# Patient Record
Sex: Male | Born: 1970 | Race: White | Hispanic: No | Smoking: Former smoker
Health system: Southern US, Community
[De-identification: ages and names within clinical notes are randomized; demographics above are authoritative.]

## PROBLEM LIST (undated history)

## (undated) DIAGNOSIS — J189 Pneumonia, unspecified organism: Secondary | ICD-10-CM

## (undated) DIAGNOSIS — I10 Essential (primary) hypertension: Secondary | ICD-10-CM

## (undated) DIAGNOSIS — F329 Major depressive disorder, single episode, unspecified: Secondary | ICD-10-CM

## (undated) DIAGNOSIS — F419 Anxiety disorder, unspecified: Secondary | ICD-10-CM

## (undated) DIAGNOSIS — K219 Gastro-esophageal reflux disease without esophagitis: Secondary | ICD-10-CM

## (undated) DIAGNOSIS — E669 Obesity, unspecified: Secondary | ICD-10-CM

## (undated) DIAGNOSIS — F32A Depression, unspecified: Secondary | ICD-10-CM

## (undated) DIAGNOSIS — E785 Hyperlipidemia, unspecified: Secondary | ICD-10-CM

## (undated) HISTORY — DX: Depression, unspecified: F32.A

## (undated) HISTORY — DX: Obesity, unspecified: E66.9

## (undated) HISTORY — DX: Anxiety disorder, unspecified: F41.9

## (undated) HISTORY — PX: NASAL SEPTUM SURGERY: SHX37

## (undated) HISTORY — PX: HAND SURGERY: SHX662

## (undated) HISTORY — DX: Major depressive disorder, single episode, unspecified: F32.9

## (undated) HISTORY — DX: Essential (primary) hypertension: I10

## (undated) HISTORY — PX: RHINOPLASTY: SUR1284

## (undated) HISTORY — DX: Hyperlipidemia, unspecified: E78.5

---

## 1996-06-10 HISTORY — PX: NASAL SEPTUM SURGERY: SHX37

## 2006-10-09 ENCOUNTER — Ambulatory Visit: Payer: Self-pay | Admitting: Internal Medicine

## 2006-10-15 ENCOUNTER — Ambulatory Visit: Payer: Self-pay | Admitting: Internal Medicine

## 2006-10-15 LAB — CONVERTED CEMR LAB
Basophils Absolute: 0 10*3/uL (ref 0.0–0.1)
Basophils Relative: 0.5 % (ref 0.0–1.0)
CO2: 32 meq/L (ref 19–32)
Cholesterol: 147 mg/dL (ref 0–200)
Creatinine, Ser: 1 mg/dL (ref 0.4–1.5)
HCT: 40.2 % (ref 39.0–52.0)
Hemoglobin: 14 g/dL (ref 13.0–17.0)
Hgb A1c MFr Bld: 5.1 % (ref 4.6–6.0)
LDL Cholesterol: 88 mg/dL (ref 0–99)
MCHC: 34.7 g/dL (ref 30.0–36.0)
Monocytes Absolute: 0.7 10*3/uL (ref 0.2–0.7)
Neutrophils Relative %: 55.1 % (ref 43.0–77.0)
Potassium: 4.2 meq/L (ref 3.5–5.1)
RDW: 12 % (ref 11.5–14.6)
Sodium: 140 meq/L (ref 135–145)
TSH: 2.73 microintl units/mL (ref 0.35–5.50)
Total Bilirubin: 0.9 mg/dL (ref 0.3–1.2)
Total Protein: 7 g/dL (ref 6.0–8.3)

## 2006-12-17 ENCOUNTER — Ambulatory Visit: Payer: Self-pay | Admitting: Internal Medicine

## 2007-04-16 DIAGNOSIS — J342 Deviated nasal septum: Secondary | ICD-10-CM | POA: Insufficient documentation

## 2007-04-16 DIAGNOSIS — E669 Obesity, unspecified: Secondary | ICD-10-CM | POA: Insufficient documentation

## 2007-04-16 DIAGNOSIS — E785 Hyperlipidemia, unspecified: Secondary | ICD-10-CM | POA: Insufficient documentation

## 2007-04-16 DIAGNOSIS — K219 Gastro-esophageal reflux disease without esophagitis: Secondary | ICD-10-CM | POA: Insufficient documentation

## 2007-04-16 DIAGNOSIS — I10 Essential (primary) hypertension: Secondary | ICD-10-CM | POA: Insufficient documentation

## 2007-05-26 ENCOUNTER — Ambulatory Visit: Payer: Self-pay | Admitting: Internal Medicine

## 2007-05-26 DIAGNOSIS — F411 Generalized anxiety disorder: Secondary | ICD-10-CM | POA: Insufficient documentation

## 2007-06-08 ENCOUNTER — Ambulatory Visit: Payer: Self-pay | Admitting: Internal Medicine

## 2007-06-08 LAB — CONVERTED CEMR LAB
ALT: 29 units/L (ref 0–53)
Alkaline Phosphatase: 69 units/L (ref 39–117)
BUN: 11 mg/dL (ref 6–23)
CO2: 29 meq/L (ref 19–32)
Calcium: 9.4 mg/dL (ref 8.4–10.5)
Creatinine, Ser: 1 mg/dL (ref 0.4–1.5)
Total Bilirubin: 0.9 mg/dL (ref 0.3–1.2)
Total Protein: 7.3 g/dL (ref 6.0–8.3)

## 2007-06-09 ENCOUNTER — Encounter: Payer: Self-pay | Admitting: Internal Medicine

## 2007-08-22 ENCOUNTER — Encounter: Payer: Self-pay | Admitting: Internal Medicine

## 2007-11-24 ENCOUNTER — Ambulatory Visit: Payer: Self-pay | Admitting: Internal Medicine

## 2007-11-27 ENCOUNTER — Telehealth: Payer: Self-pay | Admitting: Internal Medicine

## 2007-12-10 ENCOUNTER — Ambulatory Visit: Payer: Self-pay | Admitting: Internal Medicine

## 2007-12-10 LAB — CONVERTED CEMR LAB
ALT: 24 units/L (ref 0–53)
AST: 21 units/L (ref 0–37)
Albumin: 4 g/dL (ref 3.5–5.2)
Alkaline Phosphatase: 58 units/L (ref 39–117)
BUN: 14 mg/dL (ref 6–23)
Bilirubin, Direct: 0.1 mg/dL (ref 0.0–0.3)
CO2: 30 meq/L (ref 19–32)
CRP, High Sensitivity: 1 (ref 0.00–5.00)
Calcium: 9.1 mg/dL (ref 8.4–10.5)
Chloride: 105 meq/L (ref 96–112)
Cholesterol: 147 mg/dL (ref 0–200)
Creatinine, Ser: 0.9 mg/dL (ref 0.4–1.5)
GFR calc Af Amer: 123 mL/min
GFR calc non Af Amer: 102 mL/min
Glucose, Bld: 103 mg/dL — ABNORMAL HIGH (ref 70–99)
HDL: 35 mg/dL — ABNORMAL LOW (ref >39.0)
Hgb A1c MFr Bld: 5.5 % (ref 4.6–6.0)
LDL Cholesterol: 97 mg/dL (ref 0–99)
Potassium: 4 meq/L (ref 3.5–5.1)
Sodium: 140 meq/L (ref 135–145)
Total Bilirubin: 0.9 mg/dL (ref 0.3–1.2)
Total CHOL/HDL Ratio: 4.2
Total Protein: 6.7 g/dL (ref 6.0–8.3)
Triglycerides: 75 mg/dL (ref 0–149)
VLDL: 15 mg/dL (ref 0–40)

## 2007-12-13 ENCOUNTER — Encounter: Payer: Self-pay | Admitting: Internal Medicine

## 2008-01-15 ENCOUNTER — Ambulatory Visit: Payer: Self-pay | Admitting: Internal Medicine

## 2008-02-16 ENCOUNTER — Ambulatory Visit (HOSPITAL_COMMUNITY): Admission: RE | Admit: 2008-02-16 | Discharge: 2008-02-16 | Payer: Self-pay | Admitting: Emergency Medicine

## 2008-09-12 ENCOUNTER — Ambulatory Visit: Payer: Self-pay | Admitting: Internal Medicine

## 2009-03-31 ENCOUNTER — Ambulatory Visit: Payer: Self-pay | Admitting: Internal Medicine

## 2009-04-03 ENCOUNTER — Telehealth: Payer: Self-pay | Admitting: Internal Medicine

## 2009-04-04 ENCOUNTER — Ambulatory Visit: Payer: Self-pay | Admitting: Internal Medicine

## 2009-04-04 LAB — CONVERTED CEMR LAB
ALT: 27 units/L (ref 0–53)
AST: 22 units/L (ref 0–37)
Alkaline Phosphatase: 70 units/L (ref 39–117)
BUN: 17 mg/dL (ref 6–23)
Bilirubin, Direct: 0 mg/dL (ref 0.0–0.3)
Chloride: 104 meq/L (ref 96–112)
HDL: 39 mg/dL — ABNORMAL LOW (ref >39.00)
LDL Cholesterol: 107 mg/dL — ABNORMAL HIGH (ref 0–99)
Potassium: 4.5 meq/L (ref 3.5–5.1)
Sodium: 140 meq/L (ref 135–145)
Total Bilirubin: 0.8 mg/dL (ref 0.3–1.2)
Total CHOL/HDL Ratio: 4
VLDL: 17.8 mg/dL (ref 0.0–40.0)

## 2009-04-05 ENCOUNTER — Telehealth: Payer: Self-pay | Admitting: Internal Medicine

## 2009-04-05 ENCOUNTER — Encounter: Payer: Self-pay | Admitting: Internal Medicine

## 2009-07-03 ENCOUNTER — Telehealth: Payer: Self-pay | Admitting: Internal Medicine

## 2009-12-12 ENCOUNTER — Telehealth: Payer: Self-pay | Admitting: Internal Medicine

## 2009-12-21 ENCOUNTER — Encounter: Payer: Self-pay | Admitting: Internal Medicine

## 2010-03-19 ENCOUNTER — Ambulatory Visit: Payer: Self-pay | Admitting: Internal Medicine

## 2010-03-30 ENCOUNTER — Ambulatory Visit: Payer: Self-pay | Admitting: Internal Medicine

## 2010-03-30 DIAGNOSIS — J309 Allergic rhinitis, unspecified: Secondary | ICD-10-CM | POA: Insufficient documentation

## 2010-04-10 ENCOUNTER — Telehealth: Payer: Self-pay | Admitting: Internal Medicine

## 2010-04-12 ENCOUNTER — Encounter: Payer: Self-pay | Admitting: Internal Medicine

## 2010-04-12 LAB — CONVERTED CEMR LAB
AST: 21 units/L (ref 0–37)
Alkaline Phosphatase: 74 units/L (ref 39–117)
Calcium: 9.2 mg/dL (ref 8.4–10.5)
GFR calc non Af Amer: 94.56 mL/min (ref >60)
HDL: 35.9 mg/dL — ABNORMAL LOW (ref >39.00)
IgE (Immunoglobulin E), Serum: 60.2 intl units/mL (ref 0.0–180.0)
LDL Cholesterol: 88 mg/dL (ref 0–99)
Potassium: 4.3 meq/L (ref 3.5–5.1)
Sodium: 140 meq/L (ref 135–145)
Total Bilirubin: 0.7 mg/dL (ref 0.3–1.2)
VLDL: 21.2 mg/dL (ref 0.0–40.0)

## 2010-04-13 ENCOUNTER — Encounter: Payer: Self-pay | Admitting: Internal Medicine

## 2010-06-19 ENCOUNTER — Telehealth: Payer: Self-pay | Admitting: Internal Medicine

## 2010-06-19 ENCOUNTER — Encounter: Payer: Self-pay | Admitting: Internal Medicine

## 2010-06-26 ENCOUNTER — Telehealth: Payer: Self-pay | Admitting: Internal Medicine

## 2010-06-27 ENCOUNTER — Encounter: Payer: Self-pay | Admitting: Internal Medicine

## 2010-07-10 NOTE — Assessment & Plan Note (Signed)
Summary: cpx/mhf--Rm    Vital Signs:  Patient profile:   40 year old male Height:      73 inches Weight:      254 pounds BMI:     33.63 O2 Sat:      97 % on Room air Temp:     97.6 degrees F oral Pulse rate:   67 / minute Pulse rhythm:   regular Resp:     16 per minute BP sitting:   130 / 70  (left arm) Cuff size:   large  Vitals Entered By: Mervin Kung CMA Duncan Dull) (March 30, 2010 2:32 PM)  O2 Flow:  Room air CC: Pt here for physical, not fasting. Has not had labs checked. Is Patient Diabetic? No Pain Assessment Patient in pain? no        Primary Care Abelino Tippin:  Dondra Spry DO  CC:  Pt here for physical and not fasting. Has not had labs checked.[name]  History of Present Illness:   40 y/o white male with hx of htn and hyperlipidemia for f/u no sign interval med hx tolerating meds  Pt states both shoulders are sore since tennis tournament last week.   slight wt loss since prev visit  anxiety / depression - stable  Preventive Screening-Counseling & Management  Alcohol-Tobacco     Alcohol drinks/day: 10 per week     Alcohol type: all     Alcohol Counseling: to decrease amount and/or frequency of alcohol intake     Smoking Status: current     Packs/Day: 0.25     Tobacco Counseling: not to resume use of tobacco products  Allergies (verified): No Known Drug Allergies  Past History:  Past Medical History: Obesity  Hypertension Hyperlipidemia  Anxiety/depression     Family History: FAMILY HISTORY:  Mother is alive, age 41, healthy. Father is age 38 and has hypertension and high cholesterol, also had a history of thyroid cancer.  Grandparents noted to have coronary disease in their 30s, and one of his grandparents is diabetic.         Social History: The patient is happily married for the last 13 years.  He works as a Economist  -  one pack per week.  Patient mainly smokes with social drinking. Alcohol use-yes (10 to 14 drinks  per week)       Review of Systems  The patient denies weight gain, chest pain, syncope, dyspnea on exertion, prolonged cough, abdominal pain, melena, hematochezia, severe indigestion/heartburn, and depression.    Physical Exam  General:  alert, well-developed, and well-nourished.   Head:  normocephalic and atraumatic.   Eyes:  pupils equal, pupils round, and pupils reactive to light.   Ears:  R ear normal and L ear normal.   Mouth:  pharynx pink and moist.   Neck:  No deformities, masses, or tenderness noted. Lungs:  normal respiratory effort and normal breath sounds.   Heart:  normal rate, regular rhythm, no murmur, and no gallop.   Abdomen:  soft, non-tender, normal bowel sounds, no masses, no hepatomegaly, and no splenomegaly.   Extremities:  No lower extremity edema  Neurologic:  cranial nerves II-XII intact and gait normal.   Psych:  normally interactive, good eye contact, not anxious appearing, and not depressed appearing.     Impression & Recommendations:  Problem # 1:  PHYSICAL EXAMINATION (ICD-V70.0) Reviewed adult health maintenance protocols.  Td Booster: Historical (07/10/2001)   Flu Vax: Historical (03/02/2010)   Chol: 164 (  04/04/2009)   HDL: 39.00 (04/04/2009)   LDL: 107 (04/04/2009)   TG: 89.0 (04/04/2009) TSH: 1.80 (04/04/2009)   HgbA1C: 5.5 (12/10/2007)     Problem # 2:  HYPERLIPIDEMIA (ICD-272.4) Assessment: Unchanged  His updated medication list for this problem includes:    Simvastatin 40 Mg Tabs (Simvastatin) ..... One by mouth qhs  Orders: T-Hepatic Function [id number]) T-Lipid Profile [phone])  Problem # 3:  ANXIETY (ICD-300.00) Assessment: Unchanged  His updated medication list for this problem includes:    Pristiq 50 Mg Tb24 (Desvenlafaxine succinate) .[name]... Take 1 tablet by mouth once a day    Clonazepam 0.5 Mg Tabs (Clonazepam) .[name]... 1/2 to one tap by mouth once daily prn  Discussed medication use and relaxation techniques.   Problem  # 4:  HYPERTENSION (ICD-401.9) Assessment: Unchanged  His updated medication list for this problem includes:    Amlodipine Besylate 5 Mg Tabs (Amlodipine besylate) ..... One by mouth once daily    Losartan Potassium 100 Mg Tabs (Losartan potassium) ..... One by mouth once daily  Orders: T-Basic Metabolic Panel [id number])  BP today: 130/70 Prior BP: 110/80 (03/31/2009)  Labs Reviewed: K+: 4.5 (04/04/2009) Creat: : 0.9 (04/04/2009)   Chol: 164 (04/04/2009)   HDL: 39.00 (04/04/2009)   LDL: 107 (04/04/2009)   TG: 89.0 (04/04/2009)  Complete Medication List: 1)  Amlodipine Besylate 5 Mg Tabs (Amlodipine besylate) .... One by mouth once daily 2)  Losartan Potassium 100 Mg Tabs (Losartan potassium) .... One by mouth once daily 3)  Simvastatin 40 Mg Tabs (Simvastatin) .... One by mouth qhs 4)  Pristiq 50 Mg Tb24 (Desvenlafaxine succinate) .... Take 1 tablet by mouth once a day 5)  Clonazepam 0.5 Mg Tabs (Clonazepam) .... 1/2 to one tap by mouth once daily prn  Other Orders: T- * Misc. Laboratory test [id number])  Patient Instructions: 1)  Please schedule a follow-up appointment in 6 months. Prescriptions: CLONAZEPAM 0.5 MG  TABS (CLONAZEPAM) 1/2 to one tap by mouth once daily prn  #30 x 2   Entered and Authorized by:   D. Thomos Lemons DO   Signed by:   D. Thomos Lemons DO on 03/30/2010   Method used:   Print then Give to Patient   RxID:   [id number] PRISTIQ 50 MG  TB24 (DESVENLAFAXINE SUCCINATE) Take 1 tablet by mouth once a day  #90 x 1   Entered and Authorized by:   D. Thomos Lemons DO   Signed by:   D. Thomos Lemons DO on 03/30/2010   Method used:   Electronically to        [facility] ([facility]ail)       [address]       [place], [place]  [address]       Ph: [phone]       Fax: [phone]   RxID:   [id number] SIMVASTATIN 40 MG  TABS (SIMVASTATIN) one by mouth qhs  #90 x 1   Entered and Authorized by:   D. Thomos Lemons DO   Signed by:    D. Thomos Lemons DO on 03/30/2010   Method used:   Electronically to        [facility]       [address]       [place], [place]  [address]       Ph: [phone]       Fax: [phone]  RxID:   [id number] LOSARTAN POTASSIUM 100 MG TABS (LOSARTAN POTASSIUM) one by mouth once daily  #90 x 1   Entered and Authorized by:   D. Thomos Lemons DO   Signed by:   D. Thomos Lemons DO on 03/30/2010   Method used:   Electronically to        [facility])       [address]       [place], [place]  [address]       Ph: [phone]       Fax: [phone]   RxID:   [id number] AMLODIPINE BESYLATE 5 MG  TABS (AMLODIPINE BESYLATE) one by mouth once daily  #90 x 1   Entered and Authorized by:   D. Thomos Lemons DO   Signed by:   D. Thomos Lemons DO on 03/30/2010   Method used:   Electronically to        [facility]       [address]       [place], [place]  [address]       Ph: [phone]       Fax: [phone]   RxID:   [id number]    Orders Added: 1)  T-Basic Metabolic Panel [phone] 2)  T-Hepatic Function [[id number]] 3)  T-Lipid Profile [[id number]] 4)  T- * Misc. Laboratory test [phone] 5)  Est. Patient age 65-39 [[id number]]   Immunization History:  Influenza Immunization History:    Influenza:  historical (03/02/2010)   Immunization History:  Influenza Immunization History:    Influenza:  Historical (03/02/2010)   Current Allergies (reviewed today): No known allergies  Appended Document: Orders Update    Clinical Lists Changes  Orders: Added new Test order of TLB-BMP (Basic Metabolic Panel-BMET) (80048-[id number]) - Signed Added new Test order of TLB-Hepatic/Liver Function Pnl (80076-HEPATIC) - Signed Added new Test order of TLB-Lipid Panel (80061-LIPID) - Signed

## 2010-07-10 NOTE — Consult Note (Signed)
Summary: [facility]  [facility]   Imported By: Lanelle Bal 01/02/2010 10:45:10  _____________________________________________________________________  External Attachment:    Type:   Image     Comment:   External Document

## 2010-07-10 NOTE — Progress Notes (Signed)
Summary: Medication Refill  Phone Note Call from Patient   Caller: Patient Call For: Henry Houston  Summary of Call: patient is scheduled to see Dr Artist Pais on 1021 for his cpx.  He needs refill ons his meds till them  Initial call taken by: Roselle Locus,  December 12, 2009 9:00 AM  Follow-up for Phone Call        call returned to patient at [phone], no answer, voice message left for patient to return the call regarding the medication refill that is needed Follow-up by: Glendell Docker CMA,  December 12, 2009 12:22 PM  Additional Follow-up for Phone Call Additional follow up Details #1::        Call reurned to patient regarding medication request, he states that it was taken care of and his wife found out that he had refills Additional Follow-up by: Glendell Docker CMA,  December 14, 2009 8:35 AM

## 2010-07-10 NOTE — Letter (Signed)
    at [facility] [address] [place], [place]  [address]  [place] Phone: [phone]      April 13, 2010   Henry Houston [address] [place], [place] [address]  RE:  LAB RESULTS  Dear  Henry Houston,  The following is an interpretation of your most recent lab tests.  Please take note of any instructions provided or changes to medications that have resulted from your lab work.     Your allergy panel was positive for multiple items.  See attached report.   Please see handout on allergen avoidance.  I suggest you use allergra 180 mg OTC regularly.       Sincerely Yours,    Dr. Thomos Lemons  Appended Document:  mailed

## 2010-07-10 NOTE — Letter (Signed)
   [name] at [facility] [address] [name], [place]  [address]  [place] Phone: [phone]      April 12, 2010   Henry Houston [address] [place], [place] [address]  RE:  LAB RESULTS  Dear  [name],  The following is an interpretation of your most recent lab tests.  Please take note of any instructions provided or changes to medications that have resulted from your lab work.  ELECTROLYTES:  Good - no changes needed  KIDNEY FUNCTION TESTS:  Good - no changes needed  LIVER FUNCTION TESTS:  Good - no changes needed  LIPID PANEL:  Good - no changes needed Triglyceride: 106.0   Cholesterol: 145   LDL: 88   HDL: 35.90   Chol/HDL%:  4         Sincerely Yours,    Dr. Thomos Lemons  Appended Document:  mailed

## 2010-07-10 NOTE — Progress Notes (Signed)
Summary: Medication Change  Phone Note Outgoing Call   Summary of Call: call pt - please make sure pt comes in for labs. also, I suggest pt take lower dose of simvastatin.  see rx.  pt should take 1/2 of current dose of 40 mg Initial call taken by: D. Thomos Lemons DO,  April 10, 2010 5:28 PM  Follow-up for Phone Call        call placed to patient at [phone]. Patient has been advised per Dr Artist Pais instructions. He states that he will have his blood work done tomorrow Follow-up by: Glendell Docker CMA,  April 11, 2010 8:58 AM    New/Updated Medications: SIMVASTATIN 20 MG TABS (SIMVASTATIN) one by mouth q pm Prescriptions: SIMVASTATIN 20 MG TABS (SIMVASTATIN) one by mouth q pm  #90 x 1   Entered and Authorized by:   D. Thomos Lemons DO   Signed by:   D. Thomos Lemons DO on 04/10/2010   Method used:   Electronically to        [facility]       [address]       [place], [place]  [address]       Ph: [phone]       Fax: [phone]   RxID:   [id number]

## 2010-07-10 NOTE — Progress Notes (Signed)
Summary: Clonazepam Refill  Phone Note Refill Request Message from:  Fax from Pharmacy on July 03, 2009 10:09 AM  Refills Requested: Medication #1:  CLONAZEPAM 0.5 MG  TABS 1/2 to one tap by mouth once daily prn. Next Appointment Scheduled: 03/30/10 Initial call taken by: Michaelle Copas,  July 03, 2009 10:09 AM  Follow-up for Phone Call        ok to refill x5 Follow-up by: D. Thomos Lemons DO,  July 03, 2009 6:01 PM  Additional Follow-up for Phone Call Additional follow up Details #1::        Rx called to pharmacy Additional Follow-up by: Glendell Docker CMA,  July 04, 2009 9:18 AM    Prescriptions: CLONAZEPAM 0.5 MG  TABS (CLONAZEPAM) 1/2 to one tap by mouth once daily prn  #30 x 5   Entered by:   Glendell Docker CMA   Authorized by:   D. Thomos Lemons DO   Signed by:   Glendell Docker CMA on 07/04/2009   Method used:   Telephoned to ...       [facility]       [address]       [place], [place]  [address]       Ph: [phone]       Fax: [phone]   RxID:   [id number]

## 2010-07-12 NOTE — Consult Note (Signed)
Summary: [facility]  [facility]   Imported By: Lanelle Bal 07/03/2010 13:52:57  _____________________________________________________________________  External Attachment:    Type:   Image     Comment:   External Document

## 2010-07-12 NOTE — Progress Notes (Signed)
Summary: Mail Order Refills  Phone Note Refill Request Message from:  Fax from Pharmacy on June 19, 2010 11:57 AM  Refills Requested: Medication #1:  PRISTIQ 50 MG  TB24 Take 1 tablet by mouth once a day   Dosage confirmed as above?Dosage Confirmed   Supply Requested: 3 months  Medication #2:  AMLODIPINE BESYLATE 5 MG  TABS one by mouth once daily   Dosage confirmed as above?Dosage Confirmed   Supply Requested: 3 months [facility]   Method Requested: Electronic Next Appointment Scheduled: No Future Appointments on file Initial call taken by: Glendell Docker CMA,  June 19, 2010 11:58 AM  Follow-up for Phone Call        ok for refill Follow-up by: D. Thomos Lemons DO,  June 19, 2010 1:08 PM  Additional Follow-up for Phone Call Additional follow up Details #1::        Rx faxed to pharmacy Additional Follow-up by: Glendell Docker CMA,  June 19, 2010 2:54 PM    Prescriptions: PRISTIQ 50 MG  TB24 (DESVENLAFAXINE SUCCINATE) Take 1 tablet by mouth once a day  #90 x 2   Entered by:   Glendell Docker CMA   Authorized by:   D. Thomos Lemons DO   Signed by:   Glendell Docker CMA on 06/19/2010   Method used:   Faxed to ...       [facility] (mail-order)             , [place]         Ph: [phone]       Fax: [phone]   RxID:   [id number] AMLODIPINE BESYLATE 5 MG  TABS (AMLODIPINE BESYLATE) one by mouth once daily  #90 x 2   Entered by:   Glendell Docker CMA   Authorized by:   D. Thomos Lemons DO   Signed by:   Glendell Docker CMA on 06/19/2010   Method used:   Faxed to ...       [facility] (mail-order)             , [place]         Ph: [phone]       Fax: [phone]   RxID:   [id number]

## 2010-07-12 NOTE — Progress Notes (Signed)
Summary: Drug Interaction  Phone Note Other Incoming   Caller: [facility]- [phone] Summary of Call: [facility] pharmacy called stating medicatiion .Amlodipine and   Simvastatin are contradindicated for -myopathy. The pharmacist would like to know if Dr Artist Pais wanted to decrease Simvastatin ,not to exceed 20mg   or change to a different. If no changes, they need to be made aware.  Reference# [id number]   Initial call taken by: Glendell Docker CMA,  June 26, 2010 2:47 PM  Follow-up for Phone Call        simvastatin already decreased to 20 mg  Follow-up by: D. Thomos Lemons DO,  June 26, 2010 3:23 PM  Additional Follow-up for Phone Call Additional follow up Details #1::        spoke with pharmacist Gus at [facility] , he has been advised of medication change. Additional Follow-up by: Glendell Docker CMA,  June 27, 2010 9:47 AM

## 2010-07-18 NOTE — Medication Information (Signed)
Summary: Simvastatin/[facility]  Simvastatin/[facility]   Imported By: Sherian Rein 07/10/2010 11:50:01  _____________________________________________________________________  External Attachment:    Type:   Image     Comment:   External Document

## 2010-10-26 NOTE — Assessment & Plan Note (Signed)
[facility]                           PRIMARY CARE OFFICE NOTE   Henry Houston, Henry Houston                       MRN:          [id number]  DATE:10/09/2006                            DOB:          1971/05/22    CHIEF COMPLAINT:  New patient to practice.   HISTORY OF PRESENT ILLNESS:  The patient is a 40 year old white male,  here to establish primary care.  He has moved to [place] from  [place], [place], approximately 4 months ago.   PAST MEDICAL HISTORY:  Significant for hypertension and high  cholesterol.  He has struggled with obesity.  He was started on blood  pressure medicines in his early 60s.  The patient states that his father  has had similar issues with hypertension at an early age.  He is not  sure if a workup for secondary hypertension has been performed.  He  reports having  negative treadmill stress test.  He is well controlled  on his current regimen of amlodipine 5 mg, Cozaar 100 mg. He denies any  personal history of heart disease, stroke or peripheral vascular  disease.   He has been on Lipitor for high cholesterol, though he is currently on  simvastatin due to cost reasons.  His last lipid panel showed that his  LDL is approximately 110, triglycerides were normal at 98, HDL was 48.   Other issues include generalized anxiety disorder.  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  His previous physician tried  several SSRIs and paroxetine generic 20 mg b.i.d. seemed to work the  best.  He denies any sexual dysfunction or other side effects.   PAST MEDICAL HISTORY:  1. Hypertension, controlled.  2. Hyperlipidemia, primary prevention.  3. History of torn ligaments in his hand, 1990s.  4. Deviated septum repair in 1998.  5. Obesity.  6. Gastroesophageal reflux disease.   CURRENT MEDICATIONS:  1. Amlodipine 5 mg once daily.  2. Cozaar 100 mg once daily.  3. Simvastatin 20 mg at bedtime.  4. Paroxetine 20 mg b.i.d.  5.  Clonazepam 0.5 mg p.r.n.   ALLERGIES TO MEDICATIONS:  None known.   SOCIAL HISTORY:  The patient is happily married for the last 9 years. He  works as a Research scientist (medical), does not have any children.   HABITS:  Occasional alcohol, averages 7 drinks per week.  He does have a  history of social smoking. He does not smoke on a regular basis.   FAMILY HISTORY:  Mother is alive, age 84, healthy. Father is age 38 and  has hypertension and high cholesterol, also had a history of thyroid  cancer.  Grandparents noted to have coronary disease in their 86s, and  one of his grandparents is diabetic.   REVIEW OF SYSTEMS:  No HEENT symptoms.  Denies any chest pain or  shortness of breath.  He plays tennis on a regular basis. No GI  symptoms.  All other systems negative.   PHYSICAL EXAMINATION:  VITAL SIGNS:  Weight is 252 pounds.  Temperature  is 97.7, pulse is 68, BP is 117/76  in the left arm in a seated position.  GENERAL:  The patient is a pleasant, somewhat overweight/obese 35-year-  old white male in no apparent distress.  HEENT:  Normocephalic, atraumatic. Pupils were equal and reactive to  light bilaterally.  Extraocular mobility was intact.  Patient was  anicteric.  Patient had mild injected conjunctivae bilaterally.  External auditory canals and tympanic membranes were clear bilaterally.  Hearing was grossly normal.  NECK:  Supple. No adenopathy, carotid bruit or thyromegaly.  CHEST:  Normal respiratory effort.  Chest was clear to auscultation  bilaterally and no rhonchi, rales or wheezing.  CARDIOVASCULAR:  Regular rate and rhythm. No significant murmurs, rubs  or gallops appreciated.  ABDOMEN:  Protuberant but nontender.  Positive bowel sounds.  No  organomegaly.  MUSCULOSKELETAL EXAM:  No clubbing, cyanosis or edema.  SKIN:  Warm and dry.  NEUROLOGIC:  Cranial nerves II-XII grossly intact.  It was nonfocal.   LABORATORY DATA:  This was from July 2007.  Comprehensive metabolic  profile  notable for serum glucose slightly elevated at 102, BUN was 12,  serum creatinine was 0.9, potassium was 4.5. His liver enzymes are  unremarkable.  Total cholesterol 176, triglycerides 90, HDL 48, LDL 110.   IMPRESSION:  1. Hypertension, controlled.  2. Hyperlipidemia/primary prevention.  3. Generalized anxiety disorder.  4. Obesity with abnormal glucose.  5. Health maintenance.   RECOMMENDATIONS:  The patient is to continue his current regimen.  We  discussed the importance of risk factor management.  He is to maintain  his cholesterol and antihypertensives.  We discussed general weight loss  measures and made some dietary suggestions for weight loss. We  established a goal of 10% of his body weight within the next 6 months'  time.   I  would like to review his records to see if workup for secondary  hypertension had been performed.  If it has not, we will order a renal  artery Doppler and check serum free metanephrine.   Followup time is in 2 or 3 months.  He was asked to have repeat follow  up labs in 1 week.     Barbette Hair. Artist Pais, DO  Electronically Signed    RDY/MedQ  DD: 10/09/2006  DT: 10/09/2006  Job #: [id number]

## 2010-11-09 ENCOUNTER — Telehealth: Payer: Self-pay | Admitting: Internal Medicine

## 2010-11-09 MED ORDER — CLONAZEPAM 0.5 MG PO TABS: ORAL_TABLET | ORAL | Status: DC

## 2010-11-09 NOTE — Telephone Encounter (Signed)
Refill-klonopin 0.5mg  tab gene. Take half to one tablet by mouth daily as needed. Qty 30. Last fill 12.12.11

## 2010-11-09 NOTE — Telephone Encounter (Signed)
Rx refill phone to [place] at [facility] on [address]

## 2010-11-09 NOTE — Telephone Encounter (Signed)
OK to send rx for #30 tabs with zero refills

## 2010-12-04 ENCOUNTER — Other Ambulatory Visit: Payer: Self-pay | Admitting: Internal Medicine

## 2010-12-04 NOTE — Telephone Encounter (Signed)
Rx refill sent to pharmacy.Patient is due for office visit 

## 2010-12-07 ENCOUNTER — Encounter: Payer: Self-pay | Admitting: Internal Medicine

## 2011-01-01 ENCOUNTER — Telehealth: Payer: Self-pay | Admitting: Internal Medicine

## 2011-01-01 NOTE — Telephone Encounter (Signed)
Rx refill denied. Patient is past due for office visit

## 2011-01-01 NOTE — Telephone Encounter (Signed)
Refill-losartan pot 100mg  tab nort. Take one tablet by mouth one time daily. Qty 30. Last fill 6.26.12

## 2011-01-08 ENCOUNTER — Telehealth: Payer: Self-pay | Admitting: Internal Medicine

## 2011-01-08 MED ORDER — LOSARTAN POTASSIUM 100 MG PO TABS: 100.0000 mg | ORAL_TABLET | Freq: Every day | ORAL | Status: DC

## 2011-01-08 NOTE — Telephone Encounter (Signed)
Patients wife robin called in stating that patient took the last losartan this morning. I told robin that he needed to be seen before more refills are givin. She did state that patient has upcoming appt on 01-16-11. She wanted to know if we can refill one time to last him until his appt. [facility] [address].

## 2011-01-08 NOTE — Telephone Encounter (Signed)
Rx refill sent to pharmacy. Patient is scheduled for follow up 01/16/2011

## 2011-01-16 ENCOUNTER — Ambulatory Visit: Payer: Self-pay | Admitting: Internal Medicine

## 2011-01-16 ENCOUNTER — Encounter: Payer: Self-pay | Admitting: Internal Medicine

## 2011-01-16 ENCOUNTER — Ambulatory Visit (INDEPENDENT_AMBULATORY_CARE_PROVIDER_SITE_OTHER): Payer: BC Managed Care – PPO | Admitting: Internal Medicine

## 2011-01-16 VITALS — BP 124/74 | HR 65 | Temp 97.9°F | Resp 20 | Wt 256.0 lb

## 2011-01-16 DIAGNOSIS — E669 Obesity, unspecified: Secondary | ICD-10-CM

## 2011-01-16 DIAGNOSIS — D229 Melanocytic nevi, unspecified: Secondary | ICD-10-CM

## 2011-01-16 DIAGNOSIS — I10 Essential (primary) hypertension: Secondary | ICD-10-CM

## 2011-01-16 DIAGNOSIS — D239 Other benign neoplasm of skin, unspecified: Secondary | ICD-10-CM

## 2011-01-16 DIAGNOSIS — E785 Hyperlipidemia, unspecified: Secondary | ICD-10-CM

## 2011-01-16 MED ORDER — AMLODIPINE BESYLATE 5 MG PO TABS: 5.0000 mg | ORAL_TABLET | Freq: Every day | ORAL | Status: DC

## 2011-01-16 MED ORDER — SIMVASTATIN 20 MG PO TABS: 20.0000 mg | ORAL_TABLET | Freq: Every evening | ORAL | Status: DC

## 2011-01-16 MED ORDER — LOSARTAN POTASSIUM 100 MG PO TABS: 100.0000 mg | ORAL_TABLET | Freq: Every day | ORAL | Status: DC

## 2011-01-16 NOTE — Patient Instructions (Signed)
Please schedule lipid/lft 272.4 and chem7 v58.69 prior to next visit 

## 2011-01-17 NOTE — Assessment & Plan Note (Signed)
Obtain fasting lipid profile and liver function tests. Recommend dietary modification, exercise and weight loss

## 2011-01-17 NOTE — Progress Notes (Signed)
  Subjective:    Patient ID: Yafet Cline, male    DOB: 07-25-1970, 40 y.o.   MRN: [id number]  HPI Patient presents to clinic for evaluation of tolerate statin therapy without myalgias or abnormal LFTs. Followed by psychiatry for history of anxiety which appears to be under good control. Notes atypical mole on right hand with a variable pigmented. No other complaints.  Review PMH, medications and allergies  Review of Systems see history of present illness     Objective:   Physical Exam  Physical Exam  Vitals reviewed. Constitutional:  appears well-developed and well-nourished. No distress.  HENT:  Head: Normocephalic and atraumatic.  Right Ear: Tympanic membrane, external ear and ear canal normal.  Left Ear: Tympanic membrane, external ear and ear canal normal.  Nose: Nose normal.  Mouth/Throat: Oropharynx is clear and moist. No oropharyngeal exudate.  Eyes: Conjunctivae and EOM are normal. Pupils are equal, round, and reactive to light. Right eye exhibits no discharge. Left eye exhibits no discharge. No scleral icterus.  Neck: Neck supple. No thyromegaly present.  Cardiovascular: Normal rate, regular rhythm and normal heart sounds.  Exam reveals no gallop and no friction rub.   No murmur heard. Pulmonary/Chest: Effort normal and breath sounds normal. No respiratory distress.  has no wheezes.  has no rales.  Lymphadenopathy:   no cervical adenopathy.  Neurological:  is alert.  Skin: Skin is warm and dry.  not diaphoretic. Between right second and third finger small mole with irregular pigmentation Psychiatric: normal mood and affect.     Assessment & Plan:

## 2011-01-17 NOTE — Assessment & Plan Note (Signed)
Stable. Continue current regimen. monitor blood pressure as an outpatient and followup in clinic as scheduled

## 2011-01-17 NOTE — Assessment & Plan Note (Signed)
Recommend beginning regular exercise program

## 2011-02-21 ENCOUNTER — Telehealth: Payer: Self-pay | Admitting: Internal Medicine

## 2011-02-21 MED ORDER — SIMVASTATIN 20 MG PO TABS: 20.0000 mg | ORAL_TABLET | Freq: Every evening | ORAL | Status: DC

## 2011-02-21 MED ORDER — LOSARTAN POTASSIUM 100 MG PO TABS: 100.0000 mg | ORAL_TABLET | Freq: Every day | ORAL | Status: DC

## 2011-02-21 NOTE — Telephone Encounter (Signed)
New rx  simvastatin 20mg  tab. Qty 90.  Losartan 100mg  tabs. Qty 90

## 2011-02-21 NOTE — Telephone Encounter (Signed)
Rx refills sent to pharmacy. 

## 2011-03-15 ENCOUNTER — Other Ambulatory Visit: Payer: Self-pay | Admitting: Internal Medicine

## 2011-03-15 NOTE — Telephone Encounter (Signed)
Rx refill sent to [facility]. 

## 2011-07-08 ENCOUNTER — Other Ambulatory Visit: Payer: BC Managed Care – PPO

## 2011-07-15 ENCOUNTER — Ambulatory Visit: Payer: BC Managed Care – PPO | Admitting: Internal Medicine

## 2011-07-26 ENCOUNTER — Ambulatory Visit (INDEPENDENT_AMBULATORY_CARE_PROVIDER_SITE_OTHER): Payer: 59 | Admitting: Internal Medicine

## 2011-07-26 ENCOUNTER — Encounter: Payer: Self-pay | Admitting: Internal Medicine

## 2011-07-26 DIAGNOSIS — I10 Essential (primary) hypertension: Secondary | ICD-10-CM

## 2011-07-26 DIAGNOSIS — M545 Low back pain, unspecified: Secondary | ICD-10-CM

## 2011-07-26 MED ORDER — AMLODIPINE BESYLATE 5 MG PO TABS: 5.0000 mg | ORAL_TABLET | Freq: Every day | ORAL | Status: DC

## 2011-07-26 MED ORDER — LOSARTAN POTASSIUM 100 MG PO TABS: 100.0000 mg | ORAL_TABLET | Freq: Every day | ORAL | Status: DC

## 2011-07-26 MED ORDER — SIMVASTATIN 20 MG PO TABS: 20.0000 mg | ORAL_TABLET | Freq: Every evening | ORAL | Status: DC

## 2011-07-26 NOTE — Assessment & Plan Note (Signed)
A 41 year old white male with intermittent low back pain. Symptoms may be discogenic. His symptoms have improved with rest and home exercises.  Expectant management for now. Patient advised to call office if his low back pain gets worse.

## 2011-07-26 NOTE — Progress Notes (Signed)
Subjective:    Patient ID: Henry Houston, male    DOB: 12-11-1970, 41 y.o.   MRN: [id number]  Back Pain This is a new problem. The current episode started more than 1 month ago. The problem occurs intermittently. The problem has been gradually improving since onset. The pain is present in the lumbar spine. The quality of the pain is described as aching. The pain does not radiate. The pain is mild. The symptoms are aggravated by twisting. Pertinent negatives include no abdominal pain. He has tried analgesics and home exercises for the symptoms. The treatment provided mild relief.   Patient not sure wether symptoms started while playing tennis.  He rested for 1 month and recently restarted playing tennis.  Hypertension - stable   Review of Systems  Gastrointestinal: Negative for abdominal pain.  Musculoskeletal: Positive for back pain.       Past Medical History  Diagnosis Date  . Obesity   . Hypertension   . Hyperlipidemia   . Anxiety and depression     History   Social History  . Marital Status: Married    Spouse Name: N/A    Number of Children: N/A  . Years of Education: N/A   Occupational History  . Not on file.   Social History Main Topics  . Smoking status: Former Games developer  . Smokeless tobacco: Not on file   Comment: smokes  one pack per week-mainly smokes with social drinking  . Alcohol Use: Yes     10-14 drinks per week  . Drug Use: No  . Sexually Active: Not on file   Other Topics Concern  . Not on file   Social History Narrative   The patient is happily married for the last 13 years. He works as a Research scientist (physical sciences)  -  one pack per week.  Patient mainly smokes with social drinking.Alcohol use-yes (10 to 14 drinks per week)         No past surgical history on file.  Family History  Problem Relation Age of Onset  . Hypertension Father   . Hyperlipidemia Father   . Thyroid cancer Father   . Coronary artery disease      grandparents in their 19's   . Diabetes      grandparent    No Known Allergies  Current Outpatient Prescriptions on File Prior to Visit  Medication Sig Dispense Refill  . amLODipine (NORVASC) 5 MG tablet TAKE 1 TABLET DAILY  90 tablet  1  . busPIRone (BUSPAR) 10 MG tablet Take 10 mg by mouth 2 (two) times daily.        . clonazePAM (KLONOPIN) 0.5 MG tablet 1/2 to one tablet by mouth once daily as needed  30 tablet  0  . losartan (COZAAR) 100 MG tablet Take 1 tablet (100 mg total) by mouth daily.  90 tablet  1  . simvastatin (ZOCOR) 20 MG tablet Take 1 tablet (20 mg total) by mouth every evening.  90 tablet  1  . venlafaxine (EFFEXOR) 75 MG tablet Take 75 mg by mouth 2 (two) times daily.          BP 130/90  Temp(Src) 98.7 F (37.1 C) (Oral)  Wt 260 lb (117.935 kg)    Objective:   Physical Exam  Constitutional: He appears well-developed and well-nourished.  Cardiovascular: Normal rate, regular rhythm and normal heart sounds.   Pulmonary/Chest: Effort normal and breath sounds normal. He has no wheezes. He has no rales.  Musculoskeletal:  Normal range of motion of lumbar spine, patient able to squat without difficulty Patient able to toe and heel walk without difficulty  Skin: Skin is warm and dry.      Assessment & Plan:

## 2011-07-26 NOTE — Assessment & Plan Note (Signed)
Stable.   Continue losartan and amlodipine.  BP with manual cuff 128/80 BP: 130/90 mmHg  Monitor BMET

## 2011-07-26 NOTE — Patient Instructions (Addendum)
Please schedule the following labs within one week ([facility]): BMET - 401.9 Fasting Lipid panel, LFTs, CRP - 272.4 Schedule next visit as CPX.

## 2011-07-29 ENCOUNTER — Other Ambulatory Visit: Payer: Self-pay | Admitting: Internal Medicine

## 2011-07-29 NOTE — Telephone Encounter (Signed)
Rx sent to [facility] on 07/26/11.

## 2011-08-02 ENCOUNTER — Other Ambulatory Visit: Payer: Self-pay | Admitting: Internal Medicine

## 2011-08-02 ENCOUNTER — Telehealth: Payer: Self-pay | Admitting: *Deleted

## 2011-08-02 ENCOUNTER — Other Ambulatory Visit (INDEPENDENT_AMBULATORY_CARE_PROVIDER_SITE_OTHER): Payer: 59

## 2011-08-02 DIAGNOSIS — E785 Hyperlipidemia, unspecified: Secondary | ICD-10-CM

## 2011-08-02 LAB — LIPID PANEL
Cholesterol: 151 mg/dL (ref 0–200)
LDL Cholesterol: 87 mg/dL (ref 0–99)
VLDL: 12.8 mg/dL (ref 0.0–40.0)

## 2011-08-02 LAB — HIGH SENSITIVITY CRP: CRP, High Sensitivity: 4.04 mg/L (ref 0.000–5.000)

## 2011-08-02 LAB — BASIC METABOLIC PANEL
BUN: 12 mg/dL (ref 6–23)
Chloride: 102 mEq/L (ref 96–112)
GFR: 90.6 mL/min (ref >60.00)
Potassium: 3.9 mEq/L (ref 3.5–5.1)
Sodium: 136 mEq/L (ref 135–145)

## 2011-08-02 NOTE — Telephone Encounter (Signed)
Needs order for lipid, hepatic and CRP put in the system.  Pt is at the lab now and the order are in the system under Dr. Rodena Medin instead of Dr. Artist Pais.

## 2011-08-02 NOTE — Telephone Encounter (Signed)
Orders have been entered 

## 2012-02-14 ENCOUNTER — Other Ambulatory Visit: Payer: Self-pay | Admitting: Internal Medicine

## 2012-03-15 ENCOUNTER — Other Ambulatory Visit: Payer: Self-pay | Admitting: Internal Medicine

## 2012-05-02 ENCOUNTER — Other Ambulatory Visit: Payer: Self-pay | Admitting: Internal Medicine

## 2012-06-01 ENCOUNTER — Other Ambulatory Visit: Payer: Self-pay | Admitting: Internal Medicine

## 2012-09-15 ENCOUNTER — Other Ambulatory Visit: Payer: Self-pay | Admitting: Internal Medicine

## 2012-09-29 ENCOUNTER — Telehealth: Payer: Self-pay | Admitting: Internal Medicine

## 2012-09-29 MED ORDER — AMLODIPINE BESYLATE 5 MG PO TABS: ORAL_TABLET | ORAL | Status: DC

## 2012-09-29 NOTE — Telephone Encounter (Signed)
rx sent in electronically 

## 2012-09-29 NOTE — Telephone Encounter (Signed)
Patient's wife called stating that he is completely out of his amlodipine besolate 5 mg 1poqd and need it sent to [facility] on [address]. Please assist as appt has already been scheduled.

## 2012-10-09 ENCOUNTER — Encounter: Payer: Self-pay | Admitting: Internal Medicine

## 2012-10-09 ENCOUNTER — Ambulatory Visit (INDEPENDENT_AMBULATORY_CARE_PROVIDER_SITE_OTHER): Payer: 59 | Admitting: Internal Medicine

## 2012-10-09 VITALS — BP 122/84 | HR 76 | Temp 97.9°F | Ht 73.0 in | Wt 263.0 lb

## 2012-10-09 DIAGNOSIS — E785 Hyperlipidemia, unspecified: Secondary | ICD-10-CM

## 2012-10-09 DIAGNOSIS — M25519 Pain in unspecified shoulder: Secondary | ICD-10-CM

## 2012-10-09 DIAGNOSIS — I10 Essential (primary) hypertension: Secondary | ICD-10-CM

## 2012-10-09 DIAGNOSIS — M25511 Pain in right shoulder: Secondary | ICD-10-CM

## 2012-10-09 LAB — TSH: TSH: 1.9 u[IU]/mL (ref 0.35–5.50)

## 2012-10-09 MED ORDER — AMLODIPINE BESYLATE 5 MG PO TABS: ORAL_TABLET | ORAL | Status: DC

## 2012-10-09 MED ORDER — LOSARTAN POTASSIUM 100 MG PO TABS: 100.0000 mg | ORAL_TABLET | Freq: Every day | ORAL | Status: DC

## 2012-10-09 MED ORDER — SIMVASTATIN 20 MG PO TABS: 20.0000 mg | ORAL_TABLET | Freq: Every day | ORAL | Status: DC

## 2012-10-09 NOTE — Assessment & Plan Note (Signed)
Blood pressure is well controlled. Continue same antihypertensive regimen. Monitor electrolytes and kidney function. BP: 122/84 mmHg

## 2012-10-09 NOTE — Assessment & Plan Note (Signed)
Continue same dose of simvastatin. Monitor fasting lipid panel and LFTs.  

## 2012-10-09 NOTE — Progress Notes (Signed)
  Subjective:    Patient ID: Henry Houston, male    DOB: 17-Apr-1971, 42 y.o.   MRN: [id number]  HPI  42 year old white male with history of hypertension, hyperlipidemia and anxiety and depression for routine followup. He denies any significant interval medical history. He's been compliant with his antihypertensives and simvastatin. He denies any adverse effects.  Patient has not played tennis for last 3 months. He tweaked his knee approximately 3 months ago. He also has mild right shoulder pain.  Review of Systems Negative for chest pain or shortness of breath  Past Medical History  Diagnosis Date  . Obesity   . Hypertension   . Hyperlipidemia   . Anxiety and depression     History   Social History  . Marital Status: Married    Spouse Name: N/A    Number of Children: N/A  . Years of Education: N/A   Occupational History  . Not on file.   Social History Main Topics  . Smoking status: Former Games developer  . Smokeless tobacco: Not on file     Comment: smokes  one pack per week-mainly smokes with social drinking  . Alcohol Use: Yes     Comment: 10-14 drinks per week  . Drug Use: No  . Sexually Active: Not on file   Other Topics Concern  . Not on file   Social History Narrative   The patient is happily married for the last 13 years.    He works as a Designer, fashion/clothing  -  one pack per week.  Patient mainly smokes with social drinking.   Alcohol use-yes (10 to 14 drinks per week)               No past surgical history on file.  Family History  Problem Relation Age of Onset  . Hypertension Father   . Hyperlipidemia Father   . Thyroid cancer Father   . Coronary artery disease      grandparents in their 25's  . Diabetes      grandparent    No Known Allergies  Current Outpatient Prescriptions on File Prior to Visit  Medication Sig Dispense Refill  . busPIRone (BUSPAR) 10 MG tablet Take 10 mg by mouth 2 (two) times daily.        . clonazePAM (KLONOPIN)  0.5 MG tablet 1/2 to one tablet by mouth once daily as needed  30 tablet  0  . venlafaxine (EFFEXOR) 75 MG tablet Take 75 mg by mouth 2 (two) times daily.         No current facility-administered medications on file prior to visit.    BP 122/84  Pulse 76  Temp(Src) 97.9 F (36.6 C) (Oral)  Ht 6\' 1"  (1.854 m)  Wt 263 lb (119.296 kg)  BMI 34.71 kg/m2       Objective:   Physical Exam  Constitutional: He is oriented to person, place, and time. He appears well-developed and well-nourished.  Cardiovascular: Normal rate, regular rhythm and normal heart sounds.   No murmur heard. Pulmonary/Chest: Effort normal and breath sounds normal. He has no wheezes.  Neurological: He is alert and oriented to person, place, and time. No cranial nerve deficit.  Skin: Skin is warm and dry.  Psychiatric: He has a normal mood and affect. His behavior is normal.          Assessment & Plan:

## 2012-10-09 NOTE — Assessment & Plan Note (Signed)
Patient with intermittent mild right shoulder pain. He has positive impingement sign. He likely has supraspinatus rotator cuff tendinitis. I advised rest and ice for next 2 weeks. I reviewed rotator cuff strengthening exercises. If worsening symptoms, refer to orthopedics for further evaluation.

## 2012-10-12 LAB — HEPATIC FUNCTION PANEL
ALT: 22 U/L (ref 0–53)
Alkaline Phosphatase: 61 U/L (ref 39–117)
Bilirubin, Direct: 0.1 mg/dL (ref 0.0–0.3)
Total Protein: 7.6 g/dL (ref 6.0–8.3)

## 2012-10-12 LAB — BASIC METABOLIC PANEL
CO2: 28 mEq/L (ref 19–32)
Chloride: 101 mEq/L (ref 96–112)
Sodium: 136 mEq/L (ref 135–145)

## 2012-10-12 LAB — LIPID PANEL: Total CHOL/HDL Ratio: 4

## 2012-12-17 ENCOUNTER — Encounter: Payer: Self-pay | Admitting: Internal Medicine

## 2013-04-15 ENCOUNTER — Other Ambulatory Visit: Payer: Self-pay

## 2013-09-27 ENCOUNTER — Other Ambulatory Visit: Payer: Self-pay | Admitting: Internal Medicine

## 2013-10-31 ENCOUNTER — Other Ambulatory Visit: Payer: Self-pay | Admitting: Internal Medicine

## 2013-11-17 ENCOUNTER — Ambulatory Visit (INDEPENDENT_AMBULATORY_CARE_PROVIDER_SITE_OTHER): Payer: 59 | Admitting: Podiatrist

## 2013-11-17 ENCOUNTER — Encounter: Payer: Self-pay | Admitting: Podiatrist

## 2013-11-17 ENCOUNTER — Ambulatory Visit (INDEPENDENT_AMBULATORY_CARE_PROVIDER_SITE_OTHER): Payer: 59

## 2013-11-17 VITALS — BP 113/72 | HR 70 | Resp 16 | Ht 73.0 in | Wt 265.0 lb

## 2013-11-17 DIAGNOSIS — M722 Plantar fascial fibromatosis: Secondary | ICD-10-CM

## 2013-11-17 NOTE — Patient Instructions (Signed)
Plantar Fasciitis (Heel Spur Syndrome) with Rehab The plantar fascia is a fibrous, ligament-like, soft-tissue structure that spans the bottom of the foot. Plantar fasciitis is a condition that causes pain in the foot due to inflammation of the tissue. SYMPTOMS   Pain and tenderness on the underneath side of the foot.  Pain that worsens with standing or walking. CAUSES  Plantar fasciitis is caused by irritation and injury to the plantar fascia on the underneath side of the foot. Common mechanisms of injury include:  Direct trauma to bottom of the foot.  Damage to a small nerve that runs under the foot where the main fascia attaches to the heel bone.  Stress placed on the plantar fascia due to bone spurs. RISK INCREASES WITH:   Activities that place stress on the plantar fascia (running, jumping, pivoting, or cutting).  Poor strength and flexibility.  Improperly fitted shoes.  Tight calf muscles.  Flat feet.  Failure to warm-up properly before activity.  Obesity. PREVENTION  Warm up and stretch properly before activity.  Allow for adequate recovery between workouts.  Maintain physical fitness:  Strength, flexibility, and endurance.  Cardiovascular fitness.  Maintain a health body weight.  Avoid stress on the plantar fascia.  Wear properly fitted shoes, including arch supports for individuals who have flat feet. PROGNOSIS  If treated properly, then the symptoms of plantar fasciitis usually resolve without surgery. However, occasionally surgery is necessary. RELATED COMPLICATIONS   Recurrent symptoms that may result in a chronic condition.  Problems of the lower back that are caused by compensating for the injury, such as limping.  Pain or weakness of the foot during push-off following surgery.  Chronic inflammation, scarring, and partial or complete fascia tear, occurring more often from repeated injections. TREATMENT  Treatment initially involves the use of  ice and medication to help reduce pain and inflammation. The use of strengthening and stretching exercises may help reduce pain with activity, especially stretches of the Achilles tendon. These exercises may be performed at home or with a therapist. Your caregiver may recommend that you use heel cups of arch supports to help reduce stress on the plantar fascia. Occasionally, corticosteroid injections are given to reduce inflammation. If symptoms persist for greater than 6 months despite non-surgical (conservative), then surgery may be recommended.  MEDICATION   If pain medication is necessary, then nonsteroidal anti-inflammatory medications, such as aspirin and ibuprofen, or other minor pain relievers, such as acetaminophen, are often recommended.  Do not take pain medication within 7 days before surgery.  Prescription pain relievers may be given if deemed necessary by your caregiver. Use only as directed and only as much as you need.  Corticosteroid injections may be given by your caregiver. These injections should be reserved for the most serious cases, because they may only be given a certain number of times. HEAT AND COLD  Cold treatment (icing) relieves pain and reduces inflammation. Cold treatment should be applied for 10 to 15 minutes every 2 to 3 hours for inflammation and pain and immediately after any activity that aggravates your symptoms. Use ice packs or massage the area with a piece of ice (ice massage).  Heat treatment may be used prior to performing the stretching and strengthening activities prescribed by your caregiver, physical therapist, or athletic trainer. Use a heat pack or soak the injury in warm water. SEEK IMMEDIATE MEDICAL CARE IF:  Treatment seems to offer no benefit, or the condition worsens.  Any medications produce adverse side effects. EXERCISES RANGE   OF MOTION (ROM) AND STRETCHING EXERCISES - Plantar Fasciitis (Heel Spur Syndrome) These exercises may help you  when beginning to rehabilitate your injury. Your symptoms may resolve with or without further involvement from your physician, physical therapist or athletic trainer. While completing these exercises, remember:   Restoring tissue flexibility helps normal motion to return to the joints. This allows healthier, less painful movement and activity.  An effective stretch should be held for at least 30 seconds.  A stretch should never be painful. You should only feel a gentle lengthening or release in the stretched tissue. RANGE OF MOTION - Toe Extension, Flexion  Sit with your right / left leg crossed over your opposite knee.  Grasp your toes and gently pull them back toward the top of your foot. You should feel a stretch on the bottom of your toes and/or foot.  Hold this stretch for __________ seconds.  Now, gently pull your toes toward the bottom of your foot. You should feel a stretch on the top of your toes and or foot.  Hold this stretch for __________ seconds. Repeat __________ times. Complete this stretch __________ times per day.  RANGE OF MOTION - Ankle Dorsiflexion, Active Assisted  Remove shoes and sit on a chair that is preferably not on a carpeted surface.  Place right / left foot under knee. Extend your opposite leg for support.  Keeping your heel down, slide your right / left foot back toward the chair until you feel a stretch at your ankle or calf. If you do not feel a stretch, slide your bottom forward to the edge of the chair, while still keeping your heel down.  Hold this stretch for __________ seconds. Repeat __________ times. Complete this stretch __________ times per day.  STRETCH  Gastroc, Standing  Place hands on wall.  Extend right / left leg, keeping the front knee somewhat bent.  Slightly point your toes inward on your back foot.  Keeping your right / left heel on the floor and your knee straight, shift your weight toward the wall, not allowing your back to  arch.  You should feel a gentle stretch in the right / left calf. Hold this position for __________ seconds. Repeat __________ times. Complete this stretch __________ times per day. STRETCH  Soleus, Standing  Place hands on wall.  Extend right / left leg, keeping the other knee somewhat bent.  Slightly point your toes inward on your back foot.  Keep your right / left heel on the floor, bend your back knee, and slightly shift your weight over the back leg so that you feel a gentle stretch deep in your back calf.  Hold this position for __________ seconds. Repeat __________ times. Complete this stretch __________ times per day. STRETCH  Gastrocsoleus, Standing  Note: This exercise can place a lot of stress on your foot and ankle. Please complete this exercise only if specifically instructed by your caregiver.   Place the ball of your right / left foot on a step, keeping your other foot firmly on the same step.  Hold on to the wall or a rail for balance.  Slowly lift your other foot, allowing your body weight to press your heel down over the edge of the step.  You should feel a stretch in your right / left calf.  Hold this position for __________ seconds.  Repeat this exercise with a slight bend in your right / left knee. Repeat __________ times. Complete this stretch __________ times per day.    STRENGTHENING EXERCISES - Plantar Fasciitis (Heel Spur Syndrome)  These exercises may help you when beginning to rehabilitate your injury. They may resolve your symptoms with or without further involvement from your physician, physical therapist or athletic trainer. While completing these exercises, remember:   Muscles can gain both the endurance and the strength needed for everyday activities through controlled exercises.  Complete these exercises as instructed by your physician, physical therapist or athletic trainer. Progress the resistance and repetitions only as guided. STRENGTH - Towel  Curls  Sit in a chair positioned on a non-carpeted surface.  Place your foot on a towel, keeping your heel on the floor.  Pull the towel toward your heel by only curling your toes. Keep your heel on the floor.  If instructed by your physician, physical therapist or athletic trainer, add ____________________ at the end of the towel. Repeat __________ times. Complete this exercise __________ times per day. STRENGTH - Ankle Inversion  Secure one end of a rubber exercise band/tubing to a fixed object (table, pole). Loop the other end around your foot just before your toes.  Place your fists between your knees. This will focus your strengthening at your ankle.  Slowly, pull your big toe up and in, making sure the band/tubing is positioned to resist the entire motion.  Hold this position for __________ seconds.  Have your muscles resist the band/tubing as it slowly pulls your foot back to the starting position. Repeat __________ times. Complete this exercises __________ times per day.  Document Released: 05/27/2005 Document Revised: 08/19/2011 Document Reviewed: 09/08/2008 [facility] Patient Information 2014 [place], [place].

## 2013-11-17 NOTE — Progress Notes (Signed)
   Subjective:    Patient ID: Henry Houston, male    DOB: August 05, 1970, 43 y.o.   MRN: [id number]  HPI Comments: "I came because I have a rupture"  Patient c/o tenderness medial arch and heel left for about 7 weeks. He was running and heard something pop. He went to orthopedist and they MRI the foot and confirmed that he had a tear. He has been in a boot. Better, but still has some discomfort running.  Patient also says that he feels like the 2nd MPJ left plantar is swollen. Been a few weeks.   Foot Pain      Review of Systems  Allergic/Immunologic: Positive for environmental allergies.  Psychiatric/Behavioral: The patient is nervous/anxious.   All other systems reviewed and are negative.      Objective:   Physical Exam  Patient is awake, alert, and oriented x 3.  In no acute distress.  Vascular status is intact with palpable pedal pulses at 2/4 DP and PT bilateral and capillary refill time within normal limits. Neurological sensation is also intact bilaterally via Semmes Weinstein monofilament at 5/5 sites. Light touch, vibratory sensation, Achilles tendon reflex is intact. Dermatological exam reveals skin color, turger and texture as normal. No open lesions present.  Musculature intact with dorsiflexion, plantarflexion, inversion, eversion.  High arched foot type noted.  Arch mildly decreased left in comparison to right.  No palpable defect noted left plantar fascia.  Mild discomfort palpated mid substance of plantar fascia left.  Prominent met heads present bilateral.  Contraction of digits present bilateral as well.      Assessment & Plan:  Torn plantar fascia x 8 weeks ago-- healing.  High arched foot type bilateral.  Plan  Recommended removable plantar fascial bracing and they were applied today.  Discussed positive benefits of orthotics and his insurance does not cover the cost of the inserts.  He is friends with Gerald Stabs at [employer] so I wrote a rx for orthotics so chris can make  them.  He wants to use them in his tennis shoes so if he elects to have [place] make them, will order a low profile device for him.

## 2013-11-19 ENCOUNTER — Ambulatory Visit (INDEPENDENT_AMBULATORY_CARE_PROVIDER_SITE_OTHER): Payer: 59 | Admitting: *Deleted

## 2013-11-19 DIAGNOSIS — M722 Plantar fascial fibromatosis: Secondary | ICD-10-CM

## 2013-11-19 NOTE — Progress Notes (Signed)
   Subjective:    Patient ID: Henry Houston, male    DOB: June 25, 1970, 43 y.o.   MRN: [id number]  HPI  FOOT SCAN.  Review of Systems     Objective:   Physical Exam        Assessment & Plan:

## 2013-12-03 ENCOUNTER — Telehealth: Payer: Self-pay | Admitting: *Deleted

## 2013-12-03 ENCOUNTER — Encounter: Payer: 59 | Admitting: Podiatry

## 2013-12-03 DIAGNOSIS — M722 Plantar fascial fibromatosis: Secondary | ICD-10-CM

## 2013-12-03 NOTE — Progress Notes (Signed)
Dispensed patient's orthotics with oral and written instructions for wearing. Patient will follow up with Dr. Valentina Lucks as needed.

## 2013-12-03 NOTE — Patient Instructions (Signed)

## 2013-12-03 NOTE — Telephone Encounter (Signed)
Supposed to come in on Monday for orthotics.  I want to check and see if they're in today and possibly pick them up today.  Please give me a call.  I returned his call and informed him that his orthotics are here.  I told him I was going to transfer him to a scheduler to make an appointment.

## 2013-12-06 ENCOUNTER — Other Ambulatory Visit: Payer: 59

## 2014-01-08 ENCOUNTER — Other Ambulatory Visit: Payer: Self-pay | Admitting: Internal Medicine

## 2014-01-11 ENCOUNTER — Telehealth: Payer: Self-pay | Admitting: *Deleted

## 2014-01-11 NOTE — Telephone Encounter (Signed)
Ok to RF x 1 but pt needs appt. He was last seen over year ago.  Schedule CPX

## 2014-01-11 NOTE — Telephone Encounter (Signed)
Left msg on triage requesting call back on husband medication. Caleld wife back she stated [facility] now [facility]. Inform pt she left msg at the wrong office, but will forward to Dr. Shawna Orleans office...Henry Houston

## 2014-01-12 MED ORDER — LOSARTAN POTASSIUM 100 MG PO TABS: ORAL_TABLET | ORAL | Status: DC

## 2014-01-12 NOTE — Addendum Note (Signed)
Addended by: Townsend Roger D on: 01/12/2014 09:32 AM   Modules accepted: Orders

## 2014-01-12 NOTE — Telephone Encounter (Signed)
rx sent in electronically, please call and schedule an appt.  Thanks

## 2014-01-20 ENCOUNTER — Ambulatory Visit: Payer: 59 | Admitting: Internal Medicine

## 2014-01-20 ENCOUNTER — Ambulatory Visit (INDEPENDENT_AMBULATORY_CARE_PROVIDER_SITE_OTHER): Payer: 59 | Admitting: Physician Assistant

## 2014-01-20 ENCOUNTER — Encounter: Payer: Self-pay | Admitting: Physician Assistant

## 2014-01-20 VITALS — BP 100/80 | HR 72 | Temp 98.3°F | Resp 18 | Wt 274.0 lb

## 2014-01-20 DIAGNOSIS — I10 Essential (primary) hypertension: Secondary | ICD-10-CM

## 2014-01-20 DIAGNOSIS — E785 Hyperlipidemia, unspecified: Secondary | ICD-10-CM

## 2014-01-20 LAB — BASIC METABOLIC PANEL
BUN: 13 mg/dL (ref 6–23)
CALCIUM: 9.3 mg/dL (ref 8.4–10.5)
CO2: 27 mEq/L (ref 19–32)
Chloride: 101 mEq/L (ref 96–112)
Creatinine, Ser: 1.1 mg/dL (ref 0.4–1.5)
GFR: 81.72 mL/min (ref >60.00)
GLUCOSE: 81 mg/dL (ref 70–99)
POTASSIUM: 3.7 meq/L (ref 3.5–5.1)
Sodium: 137 mEq/L (ref 135–145)

## 2014-01-20 MED ORDER — LOSARTAN POTASSIUM 100 MG PO TABS: ORAL_TABLET | ORAL | Status: DC

## 2014-01-20 MED ORDER — AMLODIPINE BESYLATE 5 MG PO TABS: ORAL_TABLET | ORAL | Status: DC

## 2014-01-20 MED ORDER — SIMVASTATIN 20 MG PO TABS: 20.0000 mg | ORAL_TABLET | Freq: Every day | ORAL | Status: DC

## 2014-01-20 NOTE — Progress Notes (Signed)
Pre visit review using our clinic review tool, if applicable. No additional management support is needed unless otherwise documented below in the visit note. 

## 2014-01-20 NOTE — Progress Notes (Signed)
Subjective:    Patient ID: Henry Houston, male    DOB: 01-Aug-1970, 43 y.o.   MRN: [id number]  HPI Patient is a 43 y.o. male presenting for medication management.  HTN- pt currently stable on amlodipine and cozaar. Pt is tolerating these medications well and denies any adverse effects to treatment. Patient denies fevers, chills, nausea, vomiting, diarrhea, shortness of breath, chest pain, headache, syncope.  HLD- pt is controlled by simvastatin. He is tolerating this medication well. He denies adverse effects to treatment.     Review of Systems As per HPI and are otherwise negative.   Past Medical History  Diagnosis Date  . Obesity   . Hypertension   . Hyperlipidemia   . Anxiety and depression     History   Social History  . Marital Status: Married    Spouse Name: N/A    Number of Children: N/A  . Years of Education: N/A   Occupational History  . Not on file.   Social History Main Topics  . Smoking status: Former Research scientist (life sciences)  . Smokeless tobacco: Not on file     Comment: smokes  one pack per week-mainly smokes with social drinking  . Alcohol Use: Yes     Comment: 10-14 drinks per week  . Drug Use: No  . Sexual Activity: Not on file   Other Topics Concern  . Not on file   Social History Narrative   The patient is happily married for the last 73 years.    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  Patient mainly smokes with social drinking.   Alcohol use-yes (10 to 14 drinks per week)               History reviewed. No pertinent past surgical history.  Family History  Problem Relation Age of Onset  . Hypertension Father   . Hyperlipidemia Father   . Thyroid cancer Father   . Coronary artery disease      grandparents in their 28's  . Diabetes      grandparent    No Known Allergies  Current Outpatient Prescriptions on File Prior to Visit  Medication Sig Dispense Refill  . Cetirizine HCl (ZYRTEC PO) Take by mouth.      .  venlafaxine (EFFEXOR) 75 MG tablet Take 75 mg by mouth 2 (two) times daily.        No current facility-administered medications on file prior to visit.    EXAM: BP 100/80  Pulse 72  Temp(Src) 98.3 F (36.8 C) (Oral)  Resp 18  Wt 274 lb (124.286 kg)     Objective:   Physical Exam  Nursing note and vitals reviewed. Constitutional: He is oriented to person, place, and time. He appears well-developed and well-nourished. No distress.  HENT:  Head: Normocephalic and atraumatic.  Eyes: Conjunctivae and EOM are normal. Pupils are equal, round, and reactive to light.  Cardiovascular: Normal rate, regular rhythm and intact distal pulses.   Pulmonary/Chest: Effort normal and breath sounds normal. No respiratory distress. He exhibits no tenderness.  Musculoskeletal: He exhibits no edema.  Neurological: He is alert and oriented to person, place, and time.  Skin: Skin is warm and dry. He is not diaphoretic.  Psychiatric: He has a normal mood and affect. His behavior is normal. Judgment and thought content normal.     Lab Results  Component Value Date   WBC 5.2 10/15/2006   HGB 14.0 10/15/2006  HCT 40.2 10/15/2006   PLT 214 10/15/2006   GLUCOSE 91 10/09/2012   CHOL 169 10/09/2012   TRIG 97.0 10/09/2012   HDL 44.50 10/09/2012   LDLCALC 105* 10/09/2012   ALT 22 10/09/2012   AST 24 10/09/2012   NA 136 10/09/2012   K 3.9 10/09/2012   CL 101 10/09/2012   CREATININE 1.1 10/09/2012   BUN 12 10/09/2012   CO2 28 10/09/2012   TSH 1.90 10/09/2012   HGBA1C 5.5 12/10/2007        Assessment & Plan:  Henry Houston was seen today for medication follow up.  Diagnoses and associated orders for this visit:  HYPERTENSION Comments: Stable on Coazaar and Amlodipine. Continue.  - amLODipine (NORVASC) 5 MG tablet; TAKE 1 TABLET DAILY - losartan (COZAAR) 100 MG tablet; TAKE 1 TABLET DAILY NEEDS OV - Basic Metabolic Panel  HYPERLIPIDEMIA Comments: Controlled with Simvastatin, tolerating well, continue. - simvastatin (ZOCOR) 20 MG  tablet; Take 1 tablet (20 mg total) by mouth at bedtime.    Pt will make appointment for annual physical prior to leaving today.  Return precautions provided, and patient handout on HTN and Cholesterol.  Plan to follow up as needed, or for worsening or persistent symptoms despite treatment.  Patient Instructions  Continue your current medication regimen.  Schedule appointment for an annual physical prior to leaving today.  If emergency symptoms discussed during visit developed, seek medical attention immediately.  Followup as needed, or for worsening or persistent symptoms despite treatment.

## 2014-01-20 NOTE — Patient Instructions (Addendum)
Continue your current medication regimen.  Schedule appointment for an annual physical prior to leaving today.  If emergency symptoms discussed during visit developed, seek medical attention immediately.  Followup as needed, or for worsening or persistent symptoms despite treatment.     Hypertension Hypertension is another name for high blood pressure. High blood pressure forces your heart to work harder to pump blood. A blood pressure reading has two numbers, which includes a higher number over a lower number (example: 110/72). HOME CARE   Have your blood pressure rechecked by your doctor.  Only take medicine as told by your doctor. Follow the directions carefully. The medicine does not work as well if you skip doses. Skipping doses also puts you at risk for problems.  Do not smoke.  Monitor your blood pressure at home as told by your doctor. GET HELP IF:  You think you are having a reaction to the medicine you are taking.  You have repeat headaches or feel dizzy.  You have puffiness (swelling) in your ankles.  You have trouble with your vision. GET HELP RIGHT AWAY IF:   You get a very bad headache and are confused.  You feel weak, numb, or faint.  You get chest or belly (abdominal) pain.  You throw up (vomit).  You cannot breathe very well. MAKE SURE YOU:   Understand these instructions.  Will watch your condition.  Will get help right away if you are not doing well or get worse. Document Released: 11/13/2007 Document Revised: 06/01/2013 Document Reviewed: 03/19/2013 [facility] Patient Information 2015 [place], [place]. This information is not intended to replace advice given to you by your health care provider. Make sure you discuss any questions you have with your health care provider. Cholesterol Cholesterol is a fat. Your body needs a small amount of cholesterol. Cholesterol may build up in your blood vessels. This increases your chance of having a heart attack  or stroke. You cannot feel your cholesterol levels. The only way to know your cholesterol level is high is with a blood test. Keep your test results. Work with your doctor to keep your cholesterol at a good level. WHAT DO THE TEST RESULTS MEAN?  Total cholesterol is how much cholesterol is in your blood.  LDL is bad cholesterol. This is the type that can build up. You want LDL to be low.  HDL is good cholesterol. It cleans your blood vessels and carries LDL away. You want HDL to be high.  Triglycerides are fat that the body can burn for energy or store. WHAT ARE GOOD LEVELS OF CHOLESTEROL?  Total cholesterol below 200.  LDL below 100 for people at risk. Below 70 for those at very high risk.  HDL above 50 is good. Above 60 is best.  Triglycerides below 150. HOW CAN I LOWER MY CHOLESTEROL?  Diet. Follow your diet programs as told by your doctor.  Choose fish, white meat chicken, roasted [place], or baked [place]. Try not to eat red meat, fried foods, or processed meats such as sausage and lunch meats.  Eat lots of fresh fruits and vegetables.  Choose whole grains, beans, pasta, potatoes, and cereals.  Use only small amounts of olive, corn, or canola oils.  Try not to eat butter, mayonnaise, shortening, or palm kernel oils.  Try not to eat foods with trans fats.  Drink skim or nonfat milk. Eat low-fat or nonfat yogurt and cheeses. Try not to drink whole milk or cream. Try not to eat ice cream, egg  yolks, and full-fat cheeses.  Healthy desserts include angel food cake, ginger snaps, animal crackers, hard candy, popsicles, and low-fat or nonfat frozen yogurt. Try not to eat pastries, cakes, pies, and cookies.  Exercise. Follow your exercise programs as told by your doctor.  Be more active. You can try gardening, walking, or taking the stairs. Ask your doctor about how you can be more active.  Medicine. Take medicine as told by your doctor. Document Released: 08/23/2008 Document  Revised: 10/11/2013 Document Reviewed: 03/10/2013 [facility] Patient Information 2015 [place], [place]. This information is not intended to replace advice given to you by your health care provider. Make sure you discuss any questions you have with your health care provider.

## 2014-02-21 ENCOUNTER — Other Ambulatory Visit (INDEPENDENT_AMBULATORY_CARE_PROVIDER_SITE_OTHER): Payer: 59

## 2014-02-21 ENCOUNTER — Telehealth: Payer: Self-pay | Admitting: Internal Medicine

## 2014-02-21 DIAGNOSIS — Z0000 Encounter for general adult medical examination without abnormal findings: Secondary | ICD-10-CM

## 2014-02-21 DIAGNOSIS — R7989 Other specified abnormal findings of blood chemistry: Secondary | ICD-10-CM

## 2014-02-21 DIAGNOSIS — E785 Hyperlipidemia, unspecified: Secondary | ICD-10-CM

## 2014-02-21 LAB — BASIC METABOLIC PANEL
BUN: 13 mg/dL (ref 6–23)
CHLORIDE: 100 meq/L (ref 96–112)
CO2: 27 meq/L (ref 19–32)
CREATININE: 1.1 mg/dL (ref 0.4–1.5)
Calcium: 9.5 mg/dL (ref 8.4–10.5)
GFR: 79.95 mL/min (ref >60.00)
GLUCOSE: 96 mg/dL (ref 70–99)
Potassium: 4.7 mEq/L (ref 3.5–5.1)
Sodium: 139 mEq/L (ref 135–145)

## 2014-02-21 LAB — POCT URINALYSIS DIPSTICK
BILIRUBIN UA: NEGATIVE
Glucose, UA: NEGATIVE
KETONES UA: NEGATIVE
LEUKOCYTES UA: NEGATIVE
Nitrite, UA: NEGATIVE
PROTEIN UA: NEGATIVE
Spec Grav, UA: 1.01
Urobilinogen, UA: 0.2
pH, UA: 6

## 2014-02-21 LAB — CBC WITH DIFFERENTIAL/PLATELET
Basophils Absolute: 0 10*3/uL (ref 0.0–0.1)
Basophils Relative: 0.6 % (ref 0.0–3.0)
EOS ABS: 0.1 10*3/uL (ref 0.0–0.7)
Eosinophils Relative: 1 % (ref 0.0–5.0)
HCT: 43.6 % (ref 39.0–52.0)
Hemoglobin: 15 g/dL (ref 13.0–17.0)
LYMPHS ABS: 1.7 10*3/uL (ref 0.7–4.0)
Lymphocytes Relative: 26.1 % (ref 12.0–46.0)
MCHC: 34.5 g/dL (ref 30.0–36.0)
MCV: 91.7 fl (ref 78.0–100.0)
MONO ABS: 0.9 10*3/uL (ref 0.1–1.0)
Monocytes Relative: 13.4 % — ABNORMAL HIGH (ref 3.0–12.0)
Neutro Abs: 3.9 10*3/uL (ref 1.4–7.7)
Neutrophils Relative %: 58.9 % (ref 43.0–77.0)
PLATELETS: 272 10*3/uL (ref 150.0–400.0)
RBC: 4.76 Mil/uL (ref 4.22–5.81)
RDW: 13.3 % (ref 11.5–15.5)
WBC: 6.6 10*3/uL (ref 4.0–10.5)

## 2014-02-21 LAB — LIPID PANEL
CHOLESTEROL: 177 mg/dL (ref 0–200)
HDL: 38.3 mg/dL — ABNORMAL LOW (ref >39.00)
NonHDL: 138.7
Total CHOL/HDL Ratio: 5
Triglycerides: 251 mg/dL — ABNORMAL HIGH (ref 0.0–149.0)
VLDL: 50.2 mg/dL — AB (ref 0.0–40.0)

## 2014-02-21 LAB — HEPATIC FUNCTION PANEL
ALT: 36 U/L (ref 0–53)
AST: 38 U/L — ABNORMAL HIGH (ref 0–37)
Albumin: 4.2 g/dL (ref 3.5–5.2)
Alkaline Phosphatase: 88 U/L (ref 39–117)
BILIRUBIN DIRECT: 0 mg/dL (ref 0.0–0.3)
BILIRUBIN TOTAL: 0.5 mg/dL (ref 0.2–1.2)
TOTAL PROTEIN: 7.7 g/dL (ref 6.0–8.3)

## 2014-02-21 LAB — LDL CHOLESTEROL, DIRECT: LDL DIRECT: 113.3 mg/dL

## 2014-02-21 LAB — TSH: TSH: 2.49 u[IU]/mL (ref 0.35–4.50)

## 2014-02-21 NOTE — Telephone Encounter (Signed)
[facility] is requesting re-fill on simvastatin (ZOCOR) 20 MG tablet

## 2014-02-22 MED ORDER — SIMVASTATIN 20 MG PO TABS: 20.0000 mg | ORAL_TABLET | Freq: Every day | ORAL | Status: DC

## 2014-02-22 NOTE — Telephone Encounter (Signed)
Yes, he has tolerated this dose well and just had a follow up with me last month. So I think it would be fine

## 2014-02-22 NOTE — Telephone Encounter (Signed)
Rx sent to pharmacy   

## 2014-02-22 NOTE — Telephone Encounter (Signed)
Could you see if [facility] [place] will approve this?  He has hasn't Dr Shawna Orleans in over a year

## 2014-02-28 ENCOUNTER — Encounter: Payer: 59 | Admitting: Internal Medicine

## 2014-03-01 ENCOUNTER — Encounter: Payer: 59 | Admitting: Internal Medicine

## 2014-03-01 ENCOUNTER — Ambulatory Visit (INDEPENDENT_AMBULATORY_CARE_PROVIDER_SITE_OTHER): Payer: 59 | Admitting: Internal Medicine

## 2014-03-01 ENCOUNTER — Encounter: Payer: Self-pay | Admitting: Internal Medicine

## 2014-03-01 VITALS — BP 120/76 | HR 77 | Temp 98.0°F | Resp 20 | Ht 72.5 in | Wt 268.0 lb

## 2014-03-01 DIAGNOSIS — Z23 Encounter for immunization: Secondary | ICD-10-CM

## 2014-03-01 DIAGNOSIS — I10 Essential (primary) hypertension: Secondary | ICD-10-CM

## 2014-03-01 DIAGNOSIS — E669 Obesity, unspecified: Secondary | ICD-10-CM

## 2014-03-01 DIAGNOSIS — Z0000 Encounter for general adult medical examination without abnormal findings: Secondary | ICD-10-CM

## 2014-03-01 DIAGNOSIS — E785 Hyperlipidemia, unspecified: Secondary | ICD-10-CM

## 2014-03-01 MED ORDER — LOSARTAN POTASSIUM 100 MG PO TABS: 100.0000 mg | ORAL_TABLET | Freq: Every day | ORAL | Status: DC

## 2014-03-01 MED ORDER — AMLODIPINE BESYLATE 5 MG PO TABS: ORAL_TABLET | ORAL | Status: DC

## 2014-03-01 NOTE — Patient Instructions (Signed)
Limit your sodium (Salt) intake    It is important that you exercise regularly, at least 20 minutes 3 to 4 times per week.  If you develop chest pain or shortness of breath seek  medical attention.  You need to lose weight.  Consider a lower calorie diet and regular exercise.  Please check your blood pressure on a regular basis.  If it is consistently greater than 150/90, please make an office appointment.  Health Maintenance A healthy lifestyle and preventative care can promote health and wellness.  Maintain regular health, dental, and eye exams.  Eat a healthy diet. Foods like vegetables, fruits, whole grains, low-fat dairy products, and lean protein foods contain the nutrients you need and are low in calories. Decrease your intake of foods high in solid fats, added sugars, and salt. Get information about a proper diet from your health care provider, if necessary.  Regular physical exercise is one of the most important things you can do for your health. Most adults should get at least 150 minutes of moderate-intensity exercise (any activity that increases your heart rate and causes you to sweat) each week. In addition, most adults need muscle-strengthening exercises on 2 or more days a week.   Maintain a healthy weight. The body mass index (BMI) is a screening tool to identify possible weight problems. It provides an estimate of body fat based on height and weight. Your health care provider can find your BMI and can help you achieve or maintain a healthy weight. For males 20 years and older:  A BMI below 18.5 is considered underweight.  A BMI of 18.5 to 24.9 is normal.  A BMI of 25 to 29.9 is considered overweight.  A BMI of 30 and above is considered obese.  Maintain normal blood lipids and cholesterol by exercising and minimizing your intake of saturated fat. Eat a balanced diet with plenty of fruits and vegetables. Blood tests for lipids and cholesterol should begin at age 45 and be  repeated every 5 years. If your lipid or cholesterol levels are high, you are over age 84, or you are at high risk for heart disease, you may need your cholesterol levels checked more frequently.Ongoing high lipid and cholesterol levels should be treated with medicines if diet and exercise are not working.  If you smoke, find out from your health care provider how to quit. If you do not use tobacco, do not start.  Lung cancer screening is recommended for adults aged 64-80 years who are at high risk for developing lung cancer because of a history of smoking. A yearly low-dose CT scan of the lungs is recommended for people who have at least a 30-pack-year history of smoking and are current smokers or have quit within the past 15 years. A pack year of smoking is smoking an average of 1 pack of cigarettes a day for 1 year (for example, a 30-pack-year history of smoking could mean smoking 1 pack a day for 30 years or 2 packs a day for 15 years). Yearly screening should continue until the smoker has stopped smoking for at least 15 years. Yearly screening should be stopped for people who develop a health problem that would prevent them from having lung cancer treatment.  If you choose to drink alcohol, do not have more than 2 drinks per day. One drink is considered to be 12 oz (360 mL) of beer, 5 oz (150 mL) of wine, or 1.5 oz (45 mL) of liquor.  Avoid the  use of street drugs. Do not share needles with anyone. Ask for help if you need support or instructions about stopping the use of drugs.  High blood pressure causes heart disease and increases the risk of stroke. Blood pressure should be checked at least every 1-2 years. Ongoing high blood pressure should be treated with medicines if weight loss and exercise are not effective.  If you are 21-12 years old, ask your health care provider if you should take aspirin to prevent heart disease.  Diabetes screening involves taking a blood sample to check your  fasting blood sugar level. This should be done once every 3 years after age 101 if you are at a normal weight and without risk factors for diabetes. Testing should be considered at a younger age or be carried out more frequently if you are overweight and have at least 1 risk factor for diabetes.  Colorectal cancer can be detected and often prevented. Most routine colorectal cancer screening begins at the age of 74 and continues through age 31. However, your health care provider may recommend screening at an earlier age if you have risk factors for colon cancer. On a yearly basis, your health care provider may provide home test kits to check for hidden blood in the stool. A small camera at the end of a tube may be used to directly examine the colon (sigmoidoscopy or colonoscopy) to detect the earliest forms of colorectal cancer. Talk to your health care provider about this at age 29 when routine screening begins. A direct exam of the colon should be repeated every 5-10 years through age 38, unless early forms of precancerous polyps or small growths are found.  People who are at an increased risk for hepatitis B should be screened for this virus. You are considered at high risk for hepatitis B if:  You were born in a country where hepatitis B occurs often. Talk with your health care provider about which countries are considered high risk.  Your parents were born in a high-risk country and you have not received a shot to protect against hepatitis B (hepatitis B vaccine).  You have HIV or AIDS.  You use needles to inject street drugs.  You live with, or have sex with, someone who has hepatitis B.  You are a man who has sex with other men (MSM).  You get hemodialysis treatment.  You take certain medicines for conditions like cancer, organ transplantation, and autoimmune conditions.  Hepatitis C blood testing is recommended for all people born from [birth year] through 1963 and any individual with known risk  factors for hepatitis C.  Healthy men should no longer receive prostate-specific antigen (PSA) blood tests as part of routine cancer screening. Talk to your health care provider about prostate cancer screening.  Testicular cancer screening is not recommended for adolescents or adult males who have no symptoms. Screening includes self-exam, a health care provider exam, and other screening tests. Consult with your health care provider about any symptoms you have or any concerns you have about testicular cancer.  Practice safe sex. Use condoms and avoid high-risk sexual practices to reduce the spread of sexually transmitted infections (STIs).  You should be screened for STIs, including gonorrhea and chlamydia if:  You are sexually active and are younger than 24 years.  You are older than 24 years, and your health care provider tells you that you are at risk for this type of infection.  Your sexual activity has changed since you were last  screened, and you are at an increased risk for chlamydia or gonorrhea. Ask your health care provider if you are at risk.  If you are at risk of being infected with HIV, it is recommended that you take a prescription medicine daily to prevent HIV infection. This is called pre-exposure prophylaxis (PrEP). You are considered at risk if:  You are a man who has sex with other men (MSM).  You are a heterosexual man who is sexually active with multiple partners.  You take drugs by injection.  You are sexually active with a partner who has HIV.  Talk with your health care provider about whether you are at high risk of being infected with HIV. If you choose to begin PrEP, you should first be tested for HIV. You should then be tested every 3 months for as long as you are taking PrEP.  Use sunscreen. Apply sunscreen liberally and repeatedly throughout the day. You should seek shade when your shadow is shorter than you. Protect yourself by wearing long sleeves, pants, a  wide-brimmed hat, and sunglasses year round whenever you are outdoors.  Tell your health care provider of new moles or changes in moles, especially if there is a change in shape or color. Also, tell your health care provider if a mole is larger than the size of a pencil eraser.  A one-time screening for abdominal aortic aneurysm (AAA) and surgical repair of large AAAs by ultrasound is recommended for men aged 37-75 years who are current or former smokers.  Stay current with your vaccines (immunizations). Document Released: 11/23/2007 Document Revised: 06/01/2013 Document Reviewed: 10/22/2010 [facility] Patient Information 2015 [place], [place]. This information is not intended to replace advice given to you by your health care provider. Make sure you discuss any questions you have with your health care provider. Cardiac Diet This diet can help prevent heart disease and stroke. Many factors influence your heart health, including eating and exercise habits. Coronary risk rises a lot with abnormal blood fat (lipid) levels. Cardiac meal planning includes limiting unhealthy fats, increasing healthy fats, and making other small dietary changes. General guidelines are as follows:  Adjust calorie intake to reach and maintain desirable body weight.  Limit total fat intake to less than 30% of total calories. Saturated fat should be less than 7% of calories.  Saturated fats are found in animal products and in some vegetable products. Saturated vegetable fats are found in coconut oil, cocoa butter, palm oil, and palm kernel oil. Read labels carefully to avoid these products as much as possible. Use butter in moderation. Choose tub margarines and oils that have 2 grams of fat or less. Good cooking oils are canola and olive oils.  Practice low-fat cooking techniques. Do not fry food. Instead, broil, bake, boil, steam, grill, roast on a rack, stir-fry, or microwave it. Other fat reducing suggestions  include:  Remove the skin from poultry.  Remove all visible fat from meats.  Skim the fat off stews, soups, and gravies before serving them.  Steam vegetables in water or broth instead of sauting them in fat.  Avoid foods with trans fat (or hydrogenated oils), such as commercially fried foods and commercially baked goods. Commercial shortening and deep-frying fats will contain trans fat.  Increase intake of fruits, vegetables, whole grains, and legumes to replace foods high in fat.  Increase consumption of nuts, legumes, and seeds to at least 4 servings weekly. One serving of a legume equals  cup, and 1 serving of nuts  or seeds equals  cup.  Choose whole grains more often. Have 3 servings per day (a serving is 1 ounce [oz]).  Eat 4 to 5 servings of vegetables per day. A serving of vegetables is 1 cup of raw leafy vegetables;  cup of raw or cooked cut-up vegetables;  cup of vegetable juice.  Eat 4 to 5 servings of fruit per day. A serving of fruit is 1 medium whole fruit;  cup of dried fruit;  cup of fresh, frozen, or canned fruit;  cup of 100% fruit juice.  Increase your intake of dietary fiber to 20 to 30 grams per day. Insoluble fiber may help lower your risk of heart disease and may help curb your appetite.  Soluble fiber binds cholesterol to be removed from the blood. Foods high in soluble fiber are dried beans, citrus fruits, oats, apples, bananas, broccoli, Brussels sprouts, and eggplant.  Try to include foods fortified with plant sterols or stanols, such as yogurt, breads, juices, or margarines. Choose several fortified foods to achieve a daily intake of 2 to 3 grams of plant sterols or stanols.  Foods with omega-3 fats can help reduce your risk of heart disease. Aim to have a 3.5 oz portion of fatty fish twice per week, such as salmon, mackerel, albacore tuna, sardines, lake trout, or herring. If you wish to take a fish oil supplement, choose one that contains 1 gram of  both DHA and EPA.  Limit processed meats to 2 servings (3 oz portion) weekly.  Limit the sodium in your diet to 1500 milligrams (mg) per day. If you have high blood pressure, talk to a registered dietitian about a DASH (Dietary Approaches to Stop Hypertension) eating plan.  Limit sweets and beverages with added sugar, such as soda, to no more than 5 servings per week. One serving is:   1 tablespoon sugar.  1 tablespoon jelly or jam.   cup sorbet.  1 cup lemonade.   cup regular soda. CHOOSING FOODS Starches  Allowed: Breads: All kinds (wheat, rye, raisin, white, oatmeal, [place], [place], and English muffin bread). Low-fat rolls: English muffins, frankfurter and hamburger buns, bagels, pita bread, tortillas (not fried). Pancakes, waffles, biscuits, and muffins made with recommended oil.  Avoid: Products made with saturated or trans fats, oils, or whole milk products. Butter rolls, cheese breads, croissants. Commercial doughnuts, muffins, sweet rolls, biscuits, waffles, pancakes, store-bought mixes. Crackers  Allowed: Low-fat crackers and snacks: Animal, graham, rye, saltine (with recommended oil, no lard), oyster, and matzo crackers. Bread sticks, melba toast, rusks, flatbread, pretzels, and light popcorn.  Avoid: High-fat crackers: cheese crackers, butter crackers, and those made with coconut, palm oil, or trans fat (hydrogenated oils). Buttered popcorn. Cereals  Allowed: Hot or cold whole-grain cereals.  Avoid: Cereals containing coconut, hydrogenated vegetable fat, or animal fat. Potatoes / Pasta / Rice  Allowed: All kinds of potatoes, rice, and pasta (such as macaroni, spaghetti, and noodles).  Avoid: Pasta or rice prepared with cream sauce or high-fat cheese. Chow mein noodles, [place] fries. Vegetables  Allowed: All vegetables and vegetable juices.  Avoid: Fried vegetables. Vegetables in cream, butter, or high-fat cheese sauces. Limit coconut. Fruit in cream or  custard. Protein  Allowed: Limit your intake of meat, seafood, and poultry to no more than 6 oz (cooked weight) per day. All lean, well-trimmed beef, veal, pork, and lamb. All chicken and [place] without skin. All fish and shellfish. Wild game: wild duck, rabbit, pheasant, and venison. Egg whites or low-cholesterol egg substitutes may  be used as desired. Meatless dishes: recipes with dried beans, peas, lentils, and tofu (soybean curd). Seeds and nuts: all seeds and most nuts.  Avoid: Prime grade and other heavily marbled and fatty meats, such as short ribs, spare ribs, rib eye roast or steak, frankfurters, sausage, bacon, and high-fat luncheon meats, mutton. Caviar. Commercially fried fish. Domestic duck, goose, venison sausage. Organ meats: liver, gizzard, heart, chitterlings, brains, kidney, sweetbreads. Dairy  Allowed: Low-fat cheeses: nonfat or low-fat cottage cheese (1% or 2% fat), cheeses made with part skim milk, such as mozzarella, farmers, string, or ricotta. (Cheeses should be labeled no more than 2 to 6 grams fat per oz.). Skim (or 1%) milk: liquid, powdered, or evaporated. Buttermilk made with low-fat milk. Drinks made with skim or low-fat milk or cocoa. Chocolate milk or cocoa made with skim or low-fat (1%) milk. Nonfat or low-fat yogurt.  Avoid: Whole milk cheeses, including colby, cheddar, [place], Monterey Jack, [place], [place], [place], American, Swiss, and blue. Creamed cottage cheese, cream cheese. Whole milk and whole milk products, including buttermilk or yogurt made from whole milk, drinks made from whole milk. Condensed milk, evaporated whole milk, and 2% milk. Soups and Combination Foods  Allowed: Low-fat low-sodium soups: broth, dehydrated soups, homemade broth, soups with the fat removed, homemade cream soups made with skim or low-fat milk. Low-fat spaghetti, lasagna, chili, and Spanish rice if low-fat ingredients and low-fat cooking techniques are used.  Avoid: Cream soups  made with whole milk, cream, or high-fat cheese. All other soups. Desserts and Sweets  Allowed: Sherbet, fruit ices, gelatins, meringues, and angel food cake. Homemade desserts with recommended fats, oils, and milk products. Jam, jelly, honey, marmalade, sugars, and syrups. Pure sugar candy, such as gum drops, hard candy, jelly beans, marshmallows, mints, and small amounts of dark chocolate.  Avoid: Commercially prepared cakes, pies, cookies, frosting, pudding, or mixes for these products. Desserts containing whole milk products, chocolate, coconut, lard, palm oil, or palm kernel oil. Ice cream or ice cream drinks. Candy that contains chocolate, coconut, butter, hydrogenated fat, or unknown ingredients. Buttered syrups. Fats and Oils  Allowed: Vegetable oils: safflower, sunflower, corn, soybean, cottonseed, sesame, canola, olive, or peanut. Non-hydrogenated margarines. Salad dressing or mayonnaise: homemade or commercial, made with a recommended oil. Low or nonfat salad dressing or mayonnaise.  Limit added fats and oils to 6 to 8 tsp per day (includes fats used in cooking, baking, salads, and spreads on bread). Remember to count the "hidden fats" in foods.  Avoid: Solid fats and shortenings: butter, lard, salt pork, bacon drippings. Gravy containing meat fat, shortening, or suet. Cocoa butter, coconut. Coconut oil, palm oil, palm kernel oil, or hydrogenated oils: these ingredients are often used in bakery products, nondairy creamers, whipped toppings, candy, and commercially fried foods. Read labels carefully. Salad dressings made of unknown oils, sour cream, or cheese, such as blue cheese and Roquefort. Cream, all kinds: half-and-half, light, heavy, or whipping. Sour cream or cream cheese (even if "light" or low-fat). Nondairy cream substitutes: coffee creamers and sour cream substitutes made with palm, palm kernel, hydrogenated oils, or coconut oil. Beverages  Allowed: Coffee (regular or  decaffeinated), tea. Diet carbonated beverages, mineral water. Alcohol: Check with your caregiver. Moderation is recommended.  Avoid: Whole milk, regular sodas, and juice drinks with added sugar. Condiments  Allowed: All seasonings and condiments. Cocoa powder. "Cream" sauces made with recommended ingredients.  Avoid: Carob powder made with hydrogenated fats. SAMPLE MENU Breakfast   cup orange juice   cup oatmeal  1 slice toast  1 tsp margarine  1 cup skim milk Lunch  [place] sandwich with 2 oz [place], 2 slices bread  Lettuce and tomato slices  Fresh fruit  Carrot sticks  Coffee or tea Snack  Fresh fruit or low-fat crackers Dinner  3 oz lean ground beef  1 baked potato  1 tsp margarine   cup asparagus  Lettuce salad  1 tbs non-creamy dressing   cup peach slices  1 cup skim milk Document Released: 03/05/2008 Document Revised: 11/26/2011 Document Reviewed: 07/27/2013 [employer] Patient Information 2015 [place], [place]. This information is not intended to replace advice given to you by your health care provider. Make sure you discuss any questions you have with your health care provider.

## 2014-03-01 NOTE — Progress Notes (Signed)
Subjective:    Patient ID: Henry Houston, male    DOB: 08-Jul-1970, 43 y.o.   MRN: [id number]  HPI  43 year old patient who is seen today for a preventive health examination Medical problems included dyslipidemia and hypertension.  He is followed by Dr. Casimiro Needle for anxiety, depression.  Though quite well today without concerns or complaints Past medical history is otherwise fairly unremarkable except for exogenous obesity. Social history married no children.  Nonsmoker.  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  Father history of thyroid cancer, obesity, hypertension, and dyslipidemia Mother history of hypertension, dyslipidemia, mild obesity Her brother and in apparent good health Both grandfathers died of cardiac complications  Past Medical History  Diagnosis Date  . Obesity   . Hypertension   . Hyperlipidemia   . Anxiety and depression     History   Social History  . Marital Status: Married    Spouse Name: N/A    Number of Children: N/A  . Years of Education: N/A   Occupational History  . Not on file.   Social History Main Topics  . Smoking status: Former Research scientist (life sciences)  . Smokeless tobacco: Not on file     Comment: smokes  one pack per week-mainly smokes with social drinking  . Alcohol Use: Yes     Comment: 10-14 drinks per week  . Drug Use: No  . Sexual Activity: Not on file   Other Topics Concern  . Not on file   Social History Narrative   The patient is happily married for the last 51 years.    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  Patient mainly smokes with social drinking.   Alcohol use-yes (10 to 14 drinks per week)               History reviewed. No pertinent past surgical history.  Family History  Problem Relation Age of Onset  . Hypertension Father   . Hyperlipidemia Father   . Thyroid cancer Father   . Coronary artery disease      grandparents in their 69's  . Diabetes      grandparent    No Known  Allergies  Current Outpatient Prescriptions on File Prior to Visit  Medication Sig Dispense Refill  . buPROPion (WELLBUTRIN XL) 150 MG 24 hr tablet Take 150 mg by mouth daily.       . Cetirizine HCl (ZYRTEC PO) Take 1 tablet by mouth daily as needed.       . clonazePAM (KLONOPIN) 0.5 MG tablet Take 0.5 mg by mouth as needed.       . simvastatin (ZOCOR) 20 MG tablet Take 1 tablet (20 mg total) by mouth at bedtime.  90 tablet  1  . venlafaxine (EFFEXOR) 75 MG tablet Take 75 mg by mouth 2 (two) times daily.        No current facility-administered medications on file prior to visit.    BP 120/76  Pulse 77  Temp(Src) 98 F (36.7 C) (Oral)  Resp 20  Ht 6' 0.5" (1.842 m)  Wt 268 lb (121.564 kg)  BMI 35.83 kg/m2  SpO2 98%     Review of Systems  Constitutional: Negative for fever, chills, activity change, appetite change and fatigue.  HENT: Negative for congestion, dental problem, ear pain, hearing loss, mouth sores, rhinorrhea, sinus pressure, sneezing, tinnitus, trouble swallowing and voice change.   Eyes: Negative for photophobia, pain, redness and visual  disturbance.  Respiratory: Negative for apnea, cough, choking, chest tightness, shortness of breath and wheezing.   Cardiovascular: Negative for chest pain, palpitations and leg swelling.  Gastrointestinal: Negative for nausea, vomiting, abdominal pain, diarrhea, constipation, blood in stool, abdominal distention, anal bleeding and rectal pain.  Genitourinary: Negative for dysuria, urgency, frequency, hematuria, flank pain, decreased urine volume, discharge, penile swelling, scrotal swelling, difficulty urinating, genital sores and testicular pain.  Musculoskeletal: Negative for arthralgias, back pain, gait problem, joint swelling, myalgias, neck pain and neck stiffness.  Skin: Negative for color change, rash and wound.  Neurological: Negative for dizziness, tremors, seizures, syncope, facial asymmetry, speech difficulty, weakness,  light-headedness, numbness and headaches.  Hematological: Negative for adenopathy. Does not bruise/bleed easily.  Psychiatric/Behavioral: Negative for suicidal ideas, hallucinations, behavioral problems, confusion, sleep disturbance, self-injury, dysphoric mood, decreased concentration and agitation. The patient is not nervous/anxious.        Objective:   Physical Exam  Constitutional: He appears well-developed and well-nourished.  HENT:  Head: Normocephalic and atraumatic.  Right Ear: External ear normal.  Left Ear: External ear normal.  Nose: Nose normal.  Mouth/Throat: Oropharynx is clear and moist.  Eyes: Conjunctivae and EOM are normal. Pupils are equal, round, and reactive to light. No scleral icterus.  Neck: Normal range of motion. Neck supple. No JVD present. No thyromegaly present.  Cardiovascular: Regular rhythm, normal heart sounds and intact distal pulses.  Exam reveals no gallop and no friction rub.   No murmur heard. Pulmonary/Chest: Effort normal and breath sounds normal. He exhibits no tenderness.  Abdominal: Soft. Bowel sounds are normal. He exhibits no distension and no mass. There is no tenderness.  Genitourinary: Penis normal.  Musculoskeletal: Normal range of motion. He exhibits no edema and no tenderness.  Lymphadenopathy:    He has no cervical adenopathy.  Neurological: He is alert. He has normal reflexes. No cranial nerve deficit. Coordination normal.  Skin: Skin is warm and dry. No rash noted.  Psychiatric: He has a normal mood and affect. His behavior is normal.          Assessment & Plan:   Preventive health examination Hypertension well controlled Dyslipidemia.  Continue statin therapy, weight loss encouraged Exogenous obesity Depression.  Followup psychiatry  Lifestyle issues addressed Recheck 6 months

## 2014-03-01 NOTE — Progress Notes (Signed)
Pre visit review using our clinic review tool, if applicable. No additional management support is needed unless otherwise documented below in the visit note. 

## 2014-10-03 ENCOUNTER — Other Ambulatory Visit: Payer: Self-pay | Admitting: *Deleted

## 2014-10-03 MED ORDER — AMLODIPINE BESYLATE 5 MG PO TABS: ORAL_TABLET | ORAL | Status: DC

## 2014-11-15 ENCOUNTER — Other Ambulatory Visit: Payer: Self-pay

## 2014-11-15 MED ORDER — LOSARTAN POTASSIUM 100 MG PO TABS: 100.0000 mg | ORAL_TABLET | Freq: Every day | ORAL | Status: DC

## 2014-11-15 NOTE — Telephone Encounter (Signed)
Rx request for losartan potassium 100 mg tablet-Take 1 tablet by mouth one time daily. #90- NOTED pt needs to follow up on medications.

## 2015-02-11 ENCOUNTER — Other Ambulatory Visit: Payer: Self-pay | Admitting: Internal Medicine

## 2015-04-15 ENCOUNTER — Other Ambulatory Visit: Payer: Self-pay | Admitting: Internal Medicine

## 2015-04-20 ENCOUNTER — Other Ambulatory Visit (INDEPENDENT_AMBULATORY_CARE_PROVIDER_SITE_OTHER): Payer: BLUE CROSS/BLUE SHIELD

## 2015-04-20 ENCOUNTER — Telehealth: Payer: Self-pay | Admitting: Internal Medicine

## 2015-04-20 DIAGNOSIS — Z0000 Encounter for general adult medical examination without abnormal findings: Secondary | ICD-10-CM

## 2015-04-20 LAB — BASIC METABOLIC PANEL
BUN: 15 mg/dL (ref 6–23)
CHLORIDE: 101 meq/L (ref 96–112)
CO2: 27 meq/L (ref 19–32)
Calcium: 9.7 mg/dL (ref 8.4–10.5)
Creatinine, Ser: 1.16 mg/dL (ref 0.40–1.50)
GFR: 73.21 mL/min (ref >60.00)
Glucose, Bld: 101 mg/dL — ABNORMAL HIGH (ref 70–99)
POTASSIUM: 4.7 meq/L (ref 3.5–5.1)
Sodium: 137 mEq/L (ref 135–145)

## 2015-04-20 LAB — POCT URINALYSIS DIPSTICK
Bilirubin, UA: NEGATIVE
Blood, UA: NEGATIVE
GLUCOSE UA: NEGATIVE
Ketones, UA: NEGATIVE
LEUKOCYTES UA: NEGATIVE
NITRITE UA: NEGATIVE
Protein, UA: NEGATIVE
Spec Grav, UA: 1.02
UROBILINOGEN UA: 0.2
pH, UA: 5.5

## 2015-04-20 LAB — CBC WITH DIFFERENTIAL/PLATELET
BASOS PCT: 0.7 % (ref 0.0–3.0)
Basophils Absolute: 0 10*3/uL (ref 0.0–0.1)
EOS PCT: 1.3 % (ref 0.0–5.0)
Eosinophils Absolute: 0.1 10*3/uL (ref 0.0–0.7)
HEMATOCRIT: 44.2 % (ref 39.0–52.0)
HEMOGLOBIN: 15 g/dL (ref 13.0–17.0)
LYMPHS PCT: 27.9 % (ref 12.0–46.0)
Lymphs Abs: 1.9 10*3/uL (ref 0.7–4.0)
MCHC: 34 g/dL (ref 30.0–36.0)
MCV: 91.1 fl (ref 78.0–100.0)
MONO ABS: 0.9 10*3/uL (ref 0.1–1.0)
Monocytes Relative: 13.3 % — ABNORMAL HIGH (ref 3.0–12.0)
Neutro Abs: 3.9 10*3/uL (ref 1.4–7.7)
Neutrophils Relative %: 56.8 % (ref 43.0–77.0)
Platelets: 269 10*3/uL (ref 150.0–400.0)
RBC: 4.85 Mil/uL (ref 4.22–5.81)
RDW: 13 % (ref 11.5–15.5)
WBC: 6.8 10*3/uL (ref 4.0–10.5)

## 2015-04-20 LAB — HEPATIC FUNCTION PANEL
ALT: 22 U/L (ref 0–53)
AST: 18 U/L (ref 0–37)
Albumin: 4.5 g/dL (ref 3.5–5.2)
Alkaline Phosphatase: 78 U/L (ref 39–117)
Bilirubin, Direct: 0.1 mg/dL (ref 0.0–0.3)
TOTAL PROTEIN: 7.2 g/dL (ref 6.0–8.3)
Total Bilirubin: 0.5 mg/dL (ref 0.2–1.2)

## 2015-04-20 LAB — LIPID PANEL
CHOLESTEROL: 171 mg/dL (ref 0–200)
HDL: 45.3 mg/dL (ref >39.00)
LDL Cholesterol: 107 mg/dL — ABNORMAL HIGH (ref 0–99)
NonHDL: 125.25
TRIGLYCERIDES: 89 mg/dL (ref 0.0–149.0)
Total CHOL/HDL Ratio: 4
VLDL: 17.8 mg/dL (ref 0.0–40.0)

## 2015-04-20 LAB — TSH: TSH: 1.88 u[IU]/mL (ref 0.35–4.50)

## 2015-04-20 MED ORDER — AMLODIPINE BESYLATE 5 MG PO TABS: 5.0000 mg | ORAL_TABLET | Freq: Every day | ORAL | Status: DC

## 2015-04-20 MED ORDER — LOSARTAN POTASSIUM 100 MG PO TABS: ORAL_TABLET | ORAL | Status: DC

## 2015-04-20 NOTE — Telephone Encounter (Signed)
Patient requesting RX refill on amLODipine (NORVASC) 5 MG tablet and losartan (COZAAR) 100 MG tablet   Pharmacy: [facility]

## 2015-05-01 ENCOUNTER — Ambulatory Visit (INDEPENDENT_AMBULATORY_CARE_PROVIDER_SITE_OTHER): Payer: BLUE CROSS/BLUE SHIELD | Admitting: Family Medicine

## 2015-05-01 ENCOUNTER — Encounter: Payer: Self-pay | Admitting: Family Medicine

## 2015-05-01 VITALS — BP 120/80 | HR 103 | Temp 98.1°F | Resp 16 | Ht 72.0 in | Wt 265.4 lb

## 2015-05-01 DIAGNOSIS — E785 Hyperlipidemia, unspecified: Secondary | ICD-10-CM

## 2015-05-01 DIAGNOSIS — Z0000 Encounter for general adult medical examination without abnormal findings: Secondary | ICD-10-CM | POA: Diagnosis not present

## 2015-05-01 MED ORDER — SIMVASTATIN 20 MG PO TABS: 20.0000 mg | ORAL_TABLET | Freq: Every day | ORAL | Status: DC

## 2015-05-01 MED ORDER — AMLODIPINE BESYLATE 5 MG PO TABS: 5.0000 mg | ORAL_TABLET | Freq: Every day | ORAL | Status: DC

## 2015-05-01 MED ORDER — LOSARTAN POTASSIUM 100 MG PO TABS: ORAL_TABLET | ORAL | Status: DC

## 2015-05-01 NOTE — Progress Notes (Signed)
Pre visit review using our clinic review tool, if applicable. No additional management support is needed unless otherwise documented below in the visit note. 

## 2015-05-01 NOTE — Patient Instructions (Signed)
Hyperglycemia °Hyperglycemia occurs when the glucose (sugar) in your blood is too high. Hyperglycemia can happen for many reasons, but it most often happens to people who do not know they have diabetes or are not managing their diabetes properly.  °CAUSES  °Whether you have diabetes or not, there are other causes of hyperglycemia. Hyperglycemia can occur when you have diabetes, but it can also occur in other situations that you might not be as aware of, such as: °Diabetes °· If you have diabetes and are having problems controlling your blood glucose, hyperglycemia could occur because of some of the following reasons: °¨ Not following your meal plan. °¨ Not taking your diabetes medications or not taking it properly. °¨ Exercising less or doing less activity than you normally do. °¨ Being sick. °Pre-diabetes °· This cannot be ignored. Before people develop Type 2 diabetes, they almost always have "pre-diabetes." This is when your blood glucose levels are higher than normal, but not yet high enough to be diagnosed as diabetes. Research has shown that some long-term damage to the body, especially the heart and circulatory system, may already be occurring during pre-diabetes. If you take action to manage your blood glucose when you have pre-diabetes, you may delay or prevent Type 2 diabetes from developing. °Stress °· If you have diabetes, you may be "diet" controlled or on oral medications or insulin to control your diabetes. However, you may find that your blood glucose is higher than usual in the hospital whether you have diabetes or not. This is often referred to as "stress hyperglycemia." Stress can elevate your blood glucose. This happens because of hormones put out by the body during times of stress. If stress has been the cause of your high blood glucose, it can be followed regularly by your caregiver. That way he/she can make sure your hyperglycemia does not continue to get worse or progress to  diabetes. °Steroids °· Steroids are medications that act on the infection fighting system (immune system) to block inflammation or infection. One side effect can be a rise in blood glucose. Most people can produce enough extra insulin to allow for this rise, but for those who cannot, steroids make blood glucose levels go even higher. It is not unusual for steroid treatments to "uncover" diabetes that is developing. It is not always possible to determine if the hyperglycemia will go away after the steroids are stopped. A special blood test called an A1c is sometimes done to determine if your blood glucose was elevated before the steroids were started. °SYMPTOMS °· Thirsty. °· Frequent urination. °· Dry mouth. °· Blurred vision. °· Tired or fatigue. °· Weakness. °· Sleepy. °· Tingling in feet or leg. °DIAGNOSIS  °Diagnosis is made by monitoring blood glucose in one or all of the following ways: °· A1c test. This is a chemical found in your blood. °· Fingerstick blood glucose monitoring. °· Laboratory results. °TREATMENT  °First, knowing the cause of the hyperglycemia is important before the hyperglycemia can be treated. Treatment may include, but is not be limited to: °· Education. °· Change or adjustment in medications. °· Change or adjustment in meal plan. °· Treatment for an illness, infection, etc. °· More frequent blood glucose monitoring. °· Change in exercise plan. °· Decreasing or stopping steroids. °· Lifestyle changes. °HOME CARE INSTRUCTIONS  °· Test your blood glucose as directed. °· Exercise regularly. Your caregiver will give you instructions about exercise. Pre-diabetes or diabetes which comes on with stress is helped by exercising. °· Eat wholesome,   balanced meals. Eat often and at regular, fixed times. Your caregiver or nutritionist will give you a meal plan to guide your sugar intake. °· Being at an ideal weight is important. If needed, losing as little as 10 to 15 pounds may help improve blood  glucose levels. °SEEK MEDICAL CARE IF:  °· You have questions about medicine, activity, or diet. °· You continue to have symptoms (problems such as increased thirst, urination, or weight gain). °SEEK IMMEDIATE MEDICAL CARE IF:  °· You are vomiting or have diarrhea. °· Your breath smells fruity. °· You are breathing faster or slower. °· You are very sleepy or incoherent. °· You have numbness, tingling, or pain in your feet or hands. °· You have chest pain. °· Your symptoms get worse even though you have been following your caregiver's orders. °· If you have any other questions or concerns. °  °This information is not intended to replace advice given to you by your health care provider. Make sure you discuss any questions you have with your health care provider. °  °Document Released: 11/20/2000 Document Revised: 08/19/2011 Document Reviewed: 01/31/2015 °Elsevier Interactive Patient Education ©2016 [employer] ° °

## 2015-05-01 NOTE — Progress Notes (Signed)
   Subjective:    Patient ID: Henry Houston, male    DOB: September 12, 1970, 44 y.o.   MRN: [id number]  HPI    Review of Systems  Constitutional: Negative for fever, activity change, appetite change and fatigue.  HENT: Negative for congestion, ear pain and trouble swallowing.   Eyes: Negative for pain and visual disturbance.  Respiratory: Negative for cough, shortness of breath and wheezing.   Cardiovascular: Negative for chest pain and palpitations.  Gastrointestinal: Negative for nausea, vomiting, abdominal pain, diarrhea, constipation, blood in stool, abdominal distention and rectal pain.  Genitourinary: Negative for dysuria, hematuria and testicular pain.  Musculoskeletal: Negative for joint swelling and arthralgias.  Skin: Negative for rash.  Neurological: Negative for dizziness, syncope and headaches.  Hematological: Negative for adenopathy.  Psychiatric/Behavioral: Negative for confusion and dysphoric mood.       Objective:   Physical Exam        Assessment & Plan:

## 2015-05-01 NOTE — Progress Notes (Signed)
   Subjective:    Patient ID: Henry Houston, male    DOB: February 03, 1971, 44 y.o.   MRN: [id number]  HPI Patient here for complete physical. He has history of hypertension and hyperlipidemia. Blood pressures have been stable. Plays tennis about 3 times per week. Nonsmoker. Occasional alcohol use. Weight has been relatively stable. Has been overweight for several years. Tetanus up-to-date. Had flu vaccine through work.  Reviewed with no changes  Past Medical History  Diagnosis Date  . Obesity   . Hypertension   . Hyperlipidemia   . Anxiety and depression    No past surgical history on file.  reports that he has quit smoking. He does not have any smokeless tobacco history on file. He reports that he drinks alcohol. He reports that he does not use illicit drugs. family history includes Coronary artery disease in an other family member; Diabetes in an other family member; Hyperlipidemia in his father; Hypertension in his father; Thyroid cancer in his father. No Known Allergies    Review of Systems  Constitutional: Negative for fever, activity change, appetite change and fatigue.  HENT: Negative for congestion, ear pain and trouble swallowing.   Eyes: Negative for pain and visual disturbance.  Respiratory: Negative for cough, shortness of breath and wheezing.   Cardiovascular: Negative for chest pain and palpitations.  Gastrointestinal: Negative for nausea, vomiting, abdominal pain, diarrhea, constipation, blood in stool, abdominal distention and rectal pain.  Endocrine: Negative for polydipsia and polyuria.  Genitourinary: Negative for dysuria, hematuria and testicular pain.  Musculoskeletal: Negative for joint swelling and arthralgias.  Skin: Negative for rash.  Neurological: Negative for dizziness, syncope and headaches.  Hematological: Negative for adenopathy.  Psychiatric/Behavioral: Negative for confusion and dysphoric mood.       Objective:   Physical Exam  Constitutional: He  is oriented to person, place, and time. He appears well-developed and well-nourished. No distress.  HENT:  Head: Normocephalic and atraumatic.  Right Ear: External ear normal.  Left Ear: External ear normal.  Mouth/Throat: Oropharynx is clear and moist.  Eyes: Conjunctivae and EOM are normal. Pupils are equal, round, and reactive to light.  Neck: Normal range of motion. Neck supple. No thyromegaly present.  Cardiovascular: Normal rate, regular rhythm and normal heart sounds.   No murmur heard. Pulmonary/Chest: No respiratory distress. He has no wheezes. He has no rales.  Abdominal: Soft. Bowel sounds are normal. He exhibits no distension and no mass. There is no tenderness. There is no rebound and no guarding.  Musculoskeletal: He exhibits no edema.  Lymphadenopathy:    He has no cervical adenopathy.  Neurological: He is alert and oriented to person, place, and time. He displays normal reflexes. No cranial nerve deficit.  Skin: No rash noted.  Psychiatric: He has a normal mood and affect.          Assessment & Plan:  Physical exam. Labs reviewed. Glucose 101. Lipids stable. Reduce high glycemic foods. Lose some weight. Establish more consistent exercise.

## 2015-08-06 ENCOUNTER — Other Ambulatory Visit (HOSPITAL_COMMUNITY): Payer: Self-pay | Admitting: Psychiatry

## 2015-08-07 ENCOUNTER — Other Ambulatory Visit: Payer: Self-pay | Admitting: Internal Medicine

## 2015-08-09 ENCOUNTER — Other Ambulatory Visit (HOSPITAL_COMMUNITY): Payer: Self-pay | Admitting: Psychiatry

## 2015-10-25 DIAGNOSIS — H6591 Unspecified nonsuppurative otitis media, right ear: Secondary | ICD-10-CM | POA: Diagnosis not present

## 2015-11-16 DIAGNOSIS — F411 Generalized anxiety disorder: Secondary | ICD-10-CM | POA: Diagnosis not present

## 2015-12-06 DIAGNOSIS — F411 Generalized anxiety disorder: Secondary | ICD-10-CM | POA: Diagnosis not present

## 2015-12-19 DIAGNOSIS — F411 Generalized anxiety disorder: Secondary | ICD-10-CM | POA: Diagnosis not present

## 2016-01-09 ENCOUNTER — Emergency Department (HOSPITAL_COMMUNITY)
Admission: EM | Admit: 2016-01-09 | Discharge: 2016-01-10 | Disposition: A | Discharge status: Home / Self Care | Payer: BLUE CROSS/BLUE SHIELD | Attending: Emergency Medicine | Admitting: Emergency Medicine

## 2016-01-09 ENCOUNTER — Emergency Department (HOSPITAL_COMMUNITY): Payer: BLUE CROSS/BLUE SHIELD

## 2016-01-09 ENCOUNTER — Encounter (HOSPITAL_COMMUNITY): Payer: Self-pay | Admitting: Emergency Medicine

## 2016-01-09 DIAGNOSIS — W01198A Fall on same level from slipping, tripping and stumbling with subsequent striking against other object, initial encounter: Secondary | ICD-10-CM | POA: Diagnosis not present

## 2016-01-09 DIAGNOSIS — Z79899 Other long term (current) drug therapy: Secondary | ICD-10-CM | POA: Insufficient documentation

## 2016-01-09 DIAGNOSIS — Y92312 Tennis court as the place of occurrence of the external cause: Secondary | ICD-10-CM | POA: Diagnosis not present

## 2016-01-09 DIAGNOSIS — S0990XA Unspecified injury of head, initial encounter: Secondary | ICD-10-CM | POA: Diagnosis not present

## 2016-01-09 DIAGNOSIS — Y9373 Activity, racquet and hand sports: Secondary | ICD-10-CM | POA: Diagnosis not present

## 2016-01-09 DIAGNOSIS — S199XXA Unspecified injury of neck, initial encounter: Secondary | ICD-10-CM | POA: Diagnosis present

## 2016-01-09 DIAGNOSIS — I10 Essential (primary) hypertension: Secondary | ICD-10-CM | POA: Insufficient documentation

## 2016-01-09 DIAGNOSIS — Y999 Unspecified external cause status: Secondary | ICD-10-CM | POA: Insufficient documentation

## 2016-01-09 DIAGNOSIS — F17200 Nicotine dependence, unspecified, uncomplicated: Secondary | ICD-10-CM | POA: Insufficient documentation

## 2016-01-09 DIAGNOSIS — M542 Cervicalgia: Secondary | ICD-10-CM | POA: Diagnosis not present

## 2016-01-09 DIAGNOSIS — R51 Headache: Secondary | ICD-10-CM | POA: Diagnosis not present

## 2016-01-09 DIAGNOSIS — S161XXA Strain of muscle, fascia and tendon at neck level, initial encounter: Secondary | ICD-10-CM | POA: Diagnosis not present

## 2016-01-09 DIAGNOSIS — M545 Low back pain: Secondary | ICD-10-CM | POA: Diagnosis not present

## 2016-01-09 DIAGNOSIS — W19XXXA Unspecified fall, initial encounter: Secondary | ICD-10-CM

## 2016-01-09 NOTE — ED Triage Notes (Signed)
Pt. slipped and fell backwards while playing tennis this evening  , hit his head against the ground , denies LOC / alert and oriented , reports occipita/frontal headache  , posterior neck pain and right low back pain . C- collar applied at triage .

## 2016-01-10 MED ORDER — IBUPROFEN 200 MG PO TABS
600.0000 mg | ORAL_TABLET | Freq: Once | ORAL | Status: AC
  Administered 2016-01-10: 600 mg via ORAL
  Filled 2016-01-10: qty 1

## 2016-01-10 NOTE — Discharge Instructions (Signed)
As discussed your neck CT is normal and for the most part your head CT is as well except for a smlal area in the frontal lobe that shows a very small area of lucency, without hemorraghic appearance , or edema .  Radiology can not tell if this in new or old with no other scans to compare.  Watch for any change in your behavior, mentation, concentration, ambulation, articulation or general mood. Make an appointment with your PCP for follow up or return to the ED if you have a change in health

## 2016-01-10 NOTE — ED Notes (Signed)
NP at bedside.

## 2016-01-10 NOTE — ED Provider Notes (Signed)
[place] DEPT Provider Note   CSN: [id number] Arrival date & time: 01/09/16  2126  First Provider Contact:  First MD Initiated Contact with Patient 01/10/16 0002        History   Chief Complaint Chief Complaint  Patient presents with  . Fall  . Head Injury    HPI Henry Houston is a 45 y.o. male.  This a 45 year old gentleman who was playing tennis about 8:30 this evening when he fell backward striking the back of his head.  He's not had any visual disturbance.  No nausea, vomiting.  He does report headache.  He has not taken any medication for his discomfort.[name] He was able to get up immediately.  Continue playing tennis.  Has been no difficulty with ambulation, coordination, mentation.       Past Medical History:  Diagnosis Date  . Anxiety and depression   . Hyperlipidemia   . Hypertension   . Obesity     Patient Active Problem List   Diagnosis Date Noted  . Right shoulder pain 10/09/2012  . ANXIETY 05/26/2007  . HYPERLIPIDEMIA 04/16/2007  . OBESITY, UNSPECIFIED 04/16/2007  . HYPERTENSION 04/16/2007  . DEVIATED SEPTUM 04/16/2007  . GERD 04/16/2007    Past Surgical History:  Procedure Laterality Date  . NASAL SEPTUM SURGERY         Home Medications    Prior to Admission medications   Medication Sig Start Date End Date Taking? Authorizing Provider  amLODipine (NORVASC) 5 MG tablet Take 1 tablet (5 mg total) by mouth daily. 05/01/15   Eulas Post, MD  amLODipine (NORVASC) 5 MG tablet TAKE 1 TABLET BY MOUTH DAILY. (FOR FURTHER REFILLS MAKE AN APPT) 08/07/15   Doe-Hyun R Shawna Orleans, DO  buPROPion (WELLBUTRIN XL) 150 MG 24 hr tablet Take 150 mg by mouth daily.  01/13/14   Historical Provider, MD  Cetirizine HCl (ZYRTEC PO) Take 1 tablet by mouth daily as needed.     Historical Provider, MD  clonazePAM (KLONOPIN) 0.5 MG tablet Take 0.5 mg by mouth as needed.  01/13/14   Historical Provider, MD  losartan (COZAAR) 100 MG tablet TAKE 1 TABLET (100 MG TOTAL) BY  MOUTH DAILY. 05/01/15   Eulas Post, MD  simvastatin (ZOCOR) 20 MG tablet Take 1 tablet (20 mg total) by mouth at bedtime. 05/01/15   Eulas Post, MD  venlafaxine (EFFEXOR) 75 MG tablet Take 75 mg by mouth 2 (two) times daily.     Norma Fredrickson, MD    Family History Family History  Problem Relation Age of Onset  . Hypertension Father   . Hyperlipidemia Father   . Thyroid cancer Father   . Coronary artery disease      grandparents in their 12's  . Diabetes      grandparent    Social History Social History  Substance Use Topics  . Smoking status: Current Every Day Smoker  . Smokeless tobacco: Not on file     Comment: smokes  one pack per week-mainly smokes with social drinking  . Alcohol use Yes     Allergies   Review of patient's allergies indicates no known allergies.   Review of Systems Review of Systems  Constitutional: Negative for fever.  Eyes: Negative for visual disturbance.  Musculoskeletal: Positive for back pain and neck pain. Negative for neck stiffness.  Neurological: Positive for headaches. Negative for dizziness, speech difficulty and weakness.  All other systems reviewed and are negative.    Physical Exam Updated Vital Signs  BP 134/77   Pulse 80   Temp 97.4 F (36.3 C) (Oral)   Resp 17   Ht 6' 1"  (1.854 m)   Wt 115.7 kg   SpO2 98%   BMI 33.64 kg/m   Physical Exam  Constitutional: He appears well-developed and well-nourished. No distress.  HENT:  Head: Normocephalic.    Eyes: Pupils are equal, round, and reactive to light.  Neck: Normal range of motion.  Cardiovascular: Normal rate.   Pulmonary/Chest: Effort normal.  Musculoskeletal: Normal range of motion. He exhibits no edema, tenderness or deformity.  Lymphadenopathy:    He has no cervical adenopathy.  Neurological: He is alert.  Skin: Skin is warm and dry.  Psychiatric: He has a normal mood and affect.  Nursing note and vitals reviewed.    ED Treatments / Results    Labs (all labs ordered are listed, but only abnormal results are displayed) Labs Reviewed - No data to display  EKG  EKG Interpretation None       Radiology Dg Lumbar Spine Complete  Result Date: 01/09/2016 CLINICAL DATA:  45 year old male who fell playing tennis at 2030 hours. Pain. Initial encounter. EXAM: LUMBAR SPINE - COMPLETE 4+ VIEW COMPARISON:  CT Abdomen and Pelvis 02/16/2008. FINDINGS: Normal lumbar segmentation. Stable vertebral height and alignment. Relatively preserved disc spaces. No pars fracture. Mild lower lumbar facet hypertrophy. SI joints appear within normal limits. Visible lower thoracic levels appear intact. IMPRESSION: No acute osseous abnormality in the lumbar spine. Electronically Signed   By: Genevie Ann M.D.   On: 01/09/2016 22:34   Ct Head Wo Contrast  Result Date: 01/09/2016 CLINICAL DATA:  45 year old male fell backwards while playing tennis. Head injury. Headache. Neck Pain. Denies loss of consciousness. Initial encounter. EXAM: CT HEAD WITHOUT CONTRAST CT CERVICAL SPINE WITHOUT CONTRAST TECHNIQUE: Multidetector CT imaging of the head and cervical spine was performed following the standard protocol without intravenous contrast. Multiplanar CT image reconstructions of the cervical spine were also generated. COMPARISON:  None. FINDINGS: CT HEAD FINDINGS Visualized paranasal sinuses and mastoids are well pneumatized. No scalp hematoma identified. Negative orbits soft tissues. Calvarium intact. Possibly vascular related small linear hyperdensity near the left frontal horn measuring 3-4 mm on series 3, image 15. For tiny dystrophic calcification. Less likely this could be a tiny focus of white matter shear injury. Elsewhere no evidence of acute intracranial hemorrhage. No midline shift, ventriculomegaly, mass effect, evidence of mass lesion, or evidence of cortically based acute infarction. Gray-white matter differentiation is within normal limits throughout the brain. CT  CERVICAL SPINE FINDINGS Straightening of cervical lordosis. Visualized skull base is intact. No atlanto-occipital dissociation. Cervicothoracic junction alignment is within normal limits. Bilateral posterior element alignment is within normal limits. Mild lower cervical disc and endplate degeneration. No cervical spine fracture. Grossly intact visualized upper thoracic levels. Negative lung apices. Negative noncontrast neck soft tissues. IMPRESSION: 1. No acute fracture or listhesis identified in the cervical spine. Ligamentous injury is not excluded. 2. Probably small nontraumatic hyperdense focus in the left frontal lobe white matter near the frontal horn. And while this appearance might also be seen with white matter shear hemorrhage, the trauma mechanism and clinical symptoms render that unlikely. 3. Otherwise negative noncontrast CT appearance of the brain. Electronically Signed   By: Genevie Ann M.D.   On: 01/09/2016 23:22   Ct Cervical Spine Wo Contrast  Result Date: 01/09/2016 CLINICAL DATA:  45 year old male fell backwards while playing tennis. Head injury. Headache. Neck Pain. Denies loss of  consciousness. Initial encounter. EXAM: CT HEAD WITHOUT CONTRAST CT CERVICAL SPINE WITHOUT CONTRAST TECHNIQUE: Multidetector CT imaging of the head and cervical spine was performed following the standard protocol without intravenous contrast. Multiplanar CT image reconstructions of the cervical spine were also generated. COMPARISON:  None. FINDINGS: CT HEAD FINDINGS Visualized paranasal sinuses and mastoids are well pneumatized. No scalp hematoma identified. Negative orbits soft tissues. Calvarium intact. Possibly vascular related small linear hyperdensity near the left frontal horn measuring 3-4 mm on series 3, image 15. For tiny dystrophic calcification. Less likely this could be a tiny focus of white matter shear injury. Elsewhere no evidence of acute intracranial hemorrhage. No midline shift, ventriculomegaly, mass  effect, evidence of mass lesion, or evidence of cortically based acute infarction. Gray-white matter differentiation is within normal limits throughout the brain. CT CERVICAL SPINE FINDINGS Straightening of cervical lordosis. Visualized skull base is intact. No atlanto-occipital dissociation. Cervicothoracic junction alignment is within normal limits. Bilateral posterior element alignment is within normal limits. Mild lower cervical disc and endplate degeneration. No cervical spine fracture. Grossly intact visualized upper thoracic levels. Negative lung apices. Negative noncontrast neck soft tissues. IMPRESSION: 1. No acute fracture or listhesis identified in the cervical spine. Ligamentous injury is not excluded. 2. Probably small nontraumatic hyperdense focus in the left frontal lobe white matter near the frontal horn. And while this appearance might also be seen with white matter shear hemorrhage, the trauma mechanism and clinical symptoms render that unlikely. 3. Otherwise negative noncontrast CT appearance of the brain. Electronically Signed   By: Genevie Ann M.D.   On: 01/09/2016 23:22    Procedures Procedures (including critical care time)  Medications Ordered in ED Medications  ibuprofen (ADVIL,MOTRIN) tablet 600 mg (not administered)     Initial Impression / Assessment and Plan / ED Course  I have reviewed the triage vital signs and the nursing notes.  Pertinent labs & imaging results that were available during my care of the patient were reviewed by me and considered in my medical decision making (see chart for details).  Clinical Course     C-collar removed.  Patient moving neck in all directions without discomfort.  Does report frontal headache as well as tender occipital area without hematoma.  He'll be given ibuprofen.  I have discussed his CT scan findings with Dr. Stark Jock.  Patient does have a primary care physician that he can follow-up with.  At this time.  No further imaging is  required.  Patient has been given strict return parameters for any change in his condition, weakness, visual disturbance.  Disturbance in mentation, articulation, coordination  Final Clinical Impressions(s) / ED Diagnoses   Final diagnoses:  Fall    New Prescriptions New Prescriptions   No medications on file     Junius Creamer, NP 01/10/16 4035    Veryl Speak, MD 01/10/16 [phone]

## 2016-02-06 ENCOUNTER — Other Ambulatory Visit: Payer: Self-pay | Admitting: Internal Medicine

## 2016-02-06 NOTE — Telephone Encounter (Signed)
Rx refill sent to pharmacy. 

## 2016-02-21 DIAGNOSIS — Z23 Encounter for immunization: Secondary | ICD-10-CM | POA: Diagnosis not present

## 2016-03-12 ENCOUNTER — Encounter: Payer: Self-pay | Admitting: Family Medicine

## 2016-03-12 ENCOUNTER — Ambulatory Visit (INDEPENDENT_AMBULATORY_CARE_PROVIDER_SITE_OTHER): Payer: BLUE CROSS/BLUE SHIELD | Admitting: Family Medicine

## 2016-03-12 VITALS — BP 132/92 | HR 83 | Temp 98.5°F | Ht 72.0 in | Wt 264.6 lb

## 2016-03-12 DIAGNOSIS — F17200 Nicotine dependence, unspecified, uncomplicated: Secondary | ICD-10-CM | POA: Diagnosis not present

## 2016-03-12 DIAGNOSIS — Z789 Other specified health status: Secondary | ICD-10-CM | POA: Insufficient documentation

## 2016-03-12 DIAGNOSIS — IMO0001 Reserved for inherently not codable concepts without codable children: Secondary | ICD-10-CM

## 2016-03-12 DIAGNOSIS — I10 Essential (primary) hypertension: Secondary | ICD-10-CM | POA: Diagnosis not present

## 2016-03-12 DIAGNOSIS — Z87891 Personal history of nicotine dependence: Secondary | ICD-10-CM | POA: Insufficient documentation

## 2016-03-12 DIAGNOSIS — E785 Hyperlipidemia, unspecified: Secondary | ICD-10-CM

## 2016-03-12 DIAGNOSIS — E6609 Other obesity due to excess calories: Secondary | ICD-10-CM

## 2016-03-12 MED ORDER — AMLODIPINE BESYLATE 5 MG PO TABS: ORAL_TABLET | ORAL | 3 refills | Status: DC

## 2016-03-12 MED ORDER — SIMVASTATIN 20 MG PO TABS: 20.0000 mg | ORAL_TABLET | Freq: Every day | ORAL | 3 refills | Status: DC

## 2016-03-12 MED ORDER — LOSARTAN POTASSIUM 100 MG PO TABS: ORAL_TABLET | ORAL | 3 refills | Status: DC

## 2016-03-12 NOTE — Patient Instructions (Signed)
Need to get back on the DASH eating plan. Goal 15 lbs weight loss by follow up visit 4 months. Make this a physical with labs including a PSA a week before. This is due to your symptoms of weakened stream and peeing at night.   Always advise quitting smoking completely  Limit alcohol to 10 a week to lower some calories  No change in medication today

## 2016-03-12 NOTE — Assessment & Plan Note (Signed)
Smokes when he drinks. < 1 pack per week about 10 a week. Advised complete cessation- warned of risks upcoming for patient with HTN, HLD, potential DM with cbg mildly elevated and smoking- very tough combination for his vascular system.

## 2016-03-12 NOTE — Assessment & Plan Note (Signed)
S: poorly controlled on amlodipine 37m, losartan 1022mBP Readings from Last 3 Encounters:  03/12/16 (!) 132/92  01/10/16 142/84  05/01/15 120/80  A/P:Continue current meds:  But focus on dash eating plan and 15 lbs weight loss by follow up- counseled on these goals

## 2016-03-12 NOTE — Progress Notes (Signed)
Subjective:  Henry Houston is a 45 y.o. year old very pleasant male patient who presents for/with See problem oriented charting ROS- No chest pain or shortness of breath. No headache or blurry vision. Does have weak stream and polyuria/nocturia.see any ROS included in HPI as well.   Past Medical History-  Patient Active Problem List   Diagnosis Date Noted  . Smoker 03/12/2016    Priority: High  . GAD (generalized anxiety disorder) 05/26/2007    Priority: Medium  . Hyperlipidemia 04/16/2007    Priority: Medium  . Essential hypertension 04/16/2007    Priority: Medium  . Obesity 04/16/2007    Priority: Low  . DEVIATED SEPTUM 04/16/2007    Priority: Low  . GERD 04/16/2007    Priority: Low    Medications- reviewed and updated Current Outpatient Prescriptions  Medication Sig Dispense Refill  . amLODipine (NORVASC) 5 MG tablet TAKE 1 TABLET BY MOUTH DAILY. (FOR FURTHER REFILLS MAKE AN APPT) 90 tablet 1  . buPROPion (WELLBUTRIN XL) 150 MG 24 hr tablet Take 150 mg by mouth daily.     . Cetirizine HCl (ZYRTEC PO) Take 1 tablet by mouth daily as needed.     . clonazePAM (KLONOPIN) 0.5 MG tablet Take 0.5 mg by mouth as needed.     [name] losartan (COZAAR) 100 MG tablet TAKE 1 TABLET (100 MG TOTAL) BY MOUTH DAILY. 30 tablet 11  . simvastatin (ZOCOR) 20 MG tablet Take 1 tablet (20 mg total) by mouth at bedtime. 30 tablet 11  . venlafaxine (EFFEXOR) 75 MG tablet Take 75 mg by mouth 2 (two) times daily.      No current facility-administered medications for this visit.     Objective: BP (!) 132/92 (BP Location: Left Arm, Patient Position: Sitting, Cuff Size: Large)   Pulse 83   Temp 98.5 F (36.9 C) (Oral)   Ht 6' (1.829 m)   Wt 264 lb 9.6 oz (120 kg)   SpO2 98%   BMI 35.89 kg/m  Gen: NAD, resting comfortably CV: RRR no murmurs rubs or gallops Lungs: CTAB no crackles, wheeze, rhonchi Abdomen: soft/nontender/nondistended/normal bowel sounds. obese Ext: no edema Skin: warm,  dry  Assessment/Plan:  Essential hypertension S: poorly controlled on amlodipine 27m, losartan 1081mBP Readings from Last 3 Encounters:  03/12/16 (!) 132/92  01/10/16 142/84  05/01/15 120/80  A/P:Continue current meds:  But focus on dash eating plan and 15 lbs weight loss by follow up- counseled on these goals  Hyperlipidemia S: mild poorly controlled on simvastatin 2066mtaking every other day. No myalgias.  Lab Results  Component Value Date   CHOL 171 04/20/2015   HDL 45.30 04/20/2015   LDLCALC 107 (H) 04/20/2015   LDLDIRECT 113.3 02/21/2014   TRIG 89.0 04/20/2015   CHOLHDL 4 04/20/2015   A/P: discussed increasing dose to daily as intended as well as weight loss  Smoker Smokes when he drinks. < 1 pack per week about 10 a week. Advised complete cessation- warned of risks upcoming for patient with HTN, HLD, potential DM with cbg mildly elevated and smoking- very tough combination for his vascular system.   Over 2 years, nocturia, weaker stream - discussed doing PSA and rectal at CPE as symptomatic of potential prostate issues. UA at that time reasonable as well.   4 months follow up  The duration of face-to-face time during this visit was greater than 30 minutes. Greater than 50% of this time was spent in counseling on lifestyle changes and long term risks to  his health of current weight. .   Return precautions advised.  Garret Reddish, MD

## 2016-03-12 NOTE — Assessment & Plan Note (Signed)
S: mild poorly controlled on simvastatin 7m- taking every other day. No myalgias.  Lab Results  Component Value Date   CHOL 171 04/20/2015   HDL 45.30 04/20/2015   LDLCALC 107 (H) 04/20/2015   LDLDIRECT 113.3 02/21/2014   TRIG 89.0 04/20/2015   CHOLHDL 4 04/20/2015   A/P: discussed increasing dose to daily as intended as well as weight loss

## 2016-03-12 NOTE — Progress Notes (Signed)
Pre visit review using our clinic review tool, if applicable. No additional management support is needed unless otherwise documented below in the visit note. 

## 2016-03-14 DIAGNOSIS — F331 Major depressive disorder, recurrent, moderate: Secondary | ICD-10-CM | POA: Diagnosis not present

## 2016-04-22 DIAGNOSIS — R05 Cough: Secondary | ICD-10-CM | POA: Diagnosis not present

## 2016-04-22 DIAGNOSIS — J329 Chronic sinusitis, unspecified: Secondary | ICD-10-CM | POA: Diagnosis not present

## 2016-06-12 DIAGNOSIS — F331 Major depressive disorder, recurrent, moderate: Secondary | ICD-10-CM | POA: Diagnosis not present

## 2016-06-27 ENCOUNTER — Other Ambulatory Visit: Payer: BLUE CROSS/BLUE SHIELD

## 2016-07-04 ENCOUNTER — Encounter: Payer: BLUE CROSS/BLUE SHIELD | Admitting: Family Medicine

## 2016-07-11 ENCOUNTER — Other Ambulatory Visit (INDEPENDENT_AMBULATORY_CARE_PROVIDER_SITE_OTHER): Payer: BLUE CROSS/BLUE SHIELD

## 2016-07-11 DIAGNOSIS — Z0000 Encounter for general adult medical examination without abnormal findings: Secondary | ICD-10-CM | POA: Diagnosis not present

## 2016-07-11 LAB — POC URINALSYSI DIPSTICK (AUTOMATED)
BILIRUBIN UA: NEGATIVE
GLUCOSE UA: NEGATIVE
KETONES UA: NEGATIVE
LEUKOCYTES UA: NEGATIVE
NITRITE UA: NEGATIVE
Protein, UA: NEGATIVE
RBC UA: NEGATIVE
Spec Grav, UA: 1.025
Urobilinogen, UA: 0.2
pH, UA: 5.5

## 2016-07-11 LAB — LIPID PANEL
CHOL/HDL RATIO: 4
Cholesterol: 177 mg/dL (ref 0–200)
HDL: 47.9 mg/dL (ref >39.00)
LDL CALC: 110 mg/dL — AB (ref 0–99)
NONHDL: 128.84
TRIGLYCERIDES: 96 mg/dL (ref 0.0–149.0)
VLDL: 19.2 mg/dL (ref 0.0–40.0)

## 2016-07-11 LAB — CBC WITH DIFFERENTIAL/PLATELET
BASOS ABS: 0.1 10*3/uL (ref 0.0–0.1)
Basophils Relative: 0.9 % (ref 0.0–3.0)
EOS PCT: 1.3 % (ref 0.0–5.0)
Eosinophils Absolute: 0.1 10*3/uL (ref 0.0–0.7)
HEMATOCRIT: 42.6 % (ref 39.0–52.0)
HEMOGLOBIN: 15 g/dL (ref 13.0–17.0)
LYMPHS ABS: 1.8 10*3/uL (ref 0.7–4.0)
LYMPHS PCT: 30.8 % (ref 12.0–46.0)
MCHC: 35.2 g/dL (ref 30.0–36.0)
MCV: 89.7 fl (ref 78.0–100.0)
Monocytes Absolute: 0.8 10*3/uL (ref 0.1–1.0)
Monocytes Relative: 13.5 % — ABNORMAL HIGH (ref 3.0–12.0)
NEUTROS PCT: 53.5 % (ref 43.0–77.0)
Neutro Abs: 3.2 10*3/uL (ref 1.4–7.7)
Platelets: 251 10*3/uL (ref 150.0–400.0)
RBC: 4.76 Mil/uL (ref 4.22–5.81)
RDW: 13.4 % (ref 11.5–15.5)
WBC: 6 10*3/uL (ref 4.0–10.5)

## 2016-07-11 LAB — BASIC METABOLIC PANEL
BUN: 19 mg/dL (ref 6–23)
CALCIUM: 9.4 mg/dL (ref 8.4–10.5)
CO2: 27 mEq/L (ref 19–32)
Chloride: 104 mEq/L (ref 96–112)
Creatinine, Ser: 1.18 mg/dL (ref 0.40–1.50)
GFR: 71.36 mL/min (ref >60.00)
Glucose, Bld: 92 mg/dL (ref 70–99)
POTASSIUM: 3.9 meq/L (ref 3.5–5.1)
SODIUM: 137 meq/L (ref 135–145)

## 2016-07-11 LAB — HEPATIC FUNCTION PANEL
ALBUMIN: 4.6 g/dL (ref 3.5–5.2)
ALK PHOS: 78 U/L (ref 39–117)
ALT: 30 U/L (ref 0–53)
AST: 20 U/L (ref 0–37)
Bilirubin, Direct: 0.1 mg/dL (ref 0.0–0.3)
Total Bilirubin: 0.5 mg/dL (ref 0.2–1.2)
Total Protein: 6.6 g/dL (ref 6.0–8.3)

## 2016-07-11 LAB — PSA: PSA: 0.61 ng/mL (ref 0.10–4.00)

## 2016-07-11 LAB — TSH: TSH: 3.35 u[IU]/mL (ref 0.35–4.50)

## 2016-07-18 ENCOUNTER — Encounter: Payer: Self-pay | Admitting: Family Medicine

## 2016-07-18 ENCOUNTER — Ambulatory Visit (INDEPENDENT_AMBULATORY_CARE_PROVIDER_SITE_OTHER): Payer: BLUE CROSS/BLUE SHIELD | Admitting: Family Medicine

## 2016-07-18 VITALS — BP 132/84 | HR 96 | Temp 97.7°F | Ht 72.0 in | Wt 267.2 lb

## 2016-07-18 DIAGNOSIS — Z0000 Encounter for general adult medical examination without abnormal findings: Secondary | ICD-10-CM

## 2016-07-18 NOTE — Patient Instructions (Addendum)
Encourage you to quit smoking completely  Cutting down on alcohol is a great idea!!!  Would try to reduce diet coke by at least 50%- would make sure to remain well hydrated to help protect kidneys  Let's set a goal of 15 lbs off within 3 months. Lets see each other then to check in on weight. . I like weight watchers to help. Regular exercise at least 150 minutes a week. 3-5 servings of veggies a day.   Hopeful this will lower blood pressure as well which can help the kidneys. Would also avoid medicine like ibuprofen/aleve/motrin. Can take tylenol without affecting your kidneys
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
## 2016-07-18 NOTE — Progress Notes (Signed)
Pre visit review using our clinic review tool, if applicable. No additional management support is needed unless otherwise documented below in the visit note. 

## 2016-10-21 ENCOUNTER — Ambulatory Visit: Payer: BLUE CROSS/BLUE SHIELD | Admitting: Family Medicine

## 2016-10-21 DIAGNOSIS — D225 Melanocytic nevi of trunk: Secondary | ICD-10-CM | POA: Diagnosis not present

## 2016-10-21 DIAGNOSIS — L550 Sunburn of first degree: Secondary | ICD-10-CM | POA: Diagnosis not present

## 2016-10-21 DIAGNOSIS — D1801 Hemangioma of skin and subcutaneous tissue: Secondary | ICD-10-CM | POA: Diagnosis not present

## 2016-10-21 DIAGNOSIS — D2272 Melanocytic nevi of left lower limb, including hip: Secondary | ICD-10-CM | POA: Diagnosis not present

## 2016-10-23 DIAGNOSIS — F331 Major depressive disorder, recurrent, moderate: Secondary | ICD-10-CM | POA: Diagnosis not present

## 2016-10-28 ENCOUNTER — Ambulatory Visit (INDEPENDENT_AMBULATORY_CARE_PROVIDER_SITE_OTHER): Payer: BLUE CROSS/BLUE SHIELD | Admitting: Family Medicine

## 2016-10-28 ENCOUNTER — Encounter: Payer: Self-pay | Admitting: Family Medicine

## 2016-10-28 VITALS — BP 128/78 | HR 88 | Temp 97.9°F | Ht 72.0 in | Wt 265.4 lb

## 2016-10-28 DIAGNOSIS — I10 Essential (primary) hypertension: Secondary | ICD-10-CM

## 2016-10-28 DIAGNOSIS — Z6835 Body mass index (BMI) 35.0-35.9, adult: Secondary | ICD-10-CM | POA: Diagnosis not present

## 2016-10-28 NOTE — Patient Instructions (Signed)
Continue tennis 4x a week  Reduce Beer Intake- to 8 a week   Reduce eating out- reduce eating out from 14 meals a week to 8. Make a meal at home or pack a lunch to help reduce your eating out intake.   See each other in October at our new site. I want a weight update by mychart in 2 months. Would love to see at minimum 10 lbs off by follow up.    ______________________________________________________________________  Starting October 1st 2018, I will be transferring to our new location: [address] (corner of [address] and [address] from [employer]) [place], [place] Phone: [phone]  I would love to have you remain my patient at this new location as long as it remains convenient for you. I am excited about the opportunity to have x-ray and sports medicine in the new building but will really miss the awesome staff and physicians at [facility]. Continue to schedule appointments at [facility] and we will automatically transfer them to the [place] location starting October 1st.

## 2016-10-28 NOTE — Progress Notes (Signed)
Subjective:  Bertram Haddix is a 46 y.o. year old very pleasant male patient who presents for/with See problem oriented charting ROS- No chest pain or shortness of breath- other than gets winded with long match at times. No headache or blurry vision.    Past Medical History-  Patient Active Problem List   Diagnosis Date Noted  . Smoker 03/12/2016    Priority: High  . GAD (generalized anxiety disorder) 05/26/2007    Priority: Medium  . Hyperlipidemia 04/16/2007    Priority: Medium  . Essential hypertension 04/16/2007    Priority: Medium  . Obesity 04/16/2007    Priority: Low  . DEVIATED SEPTUM 04/16/2007    Priority: Low  . GERD 04/16/2007    Priority: Low    Medications- reviewed and updated Current Outpatient Prescriptions  Medication Sig Dispense Refill  . amLODipine (NORVASC) 5 MG tablet TAKE 1 TABLET BY MOUTH DAILY. 91 tablet 3  . buPROPion (WELLBUTRIN XL) 150 MG 24 hr tablet Take 150 mg by mouth daily.     . Cetirizine HCl (ZYRTEC PO) Take 1 tablet by mouth daily as needed.     . clonazePAM (KLONOPIN) 0.5 MG tablet Take 0.5 mg by mouth as needed.     [name] losartan (COZAAR) 100 MG tablet TAKE 1 TABLET (100 MG TOTAL) BY MOUTH DAILY. 91 tablet 3  . simvastatin (ZOCOR) 20 MG tablet Take 1 tablet (20 mg total) by mouth at bedtime. 91 tablet 3  . venlafaxine (EFFEXOR) 75 MG tablet Take 75 mg by mouth daily.      No current facility-administered medications for this visit.     Objective: BP 128/78 (BP Location: Left Arm, Patient Position: Sitting, Cuff Size: Large)   Pulse 88   Temp 97.9 F (36.6 C) (Oral)   Ht 6' (1.829 m)   Wt 265 lb 6.4 oz (120.4 kg)   SpO2 98%   BMI 35.99 kg/m  Gen: NAD, resting comfortably CV: RRR no murmurs rubs or gallops Lungs: CTAB no crackles, wheeze, rhonchi Abdomen: obese Ext: no edema Skin: warm, dry Neuro: grossly normal, moves all extremities  Assessment/Plan:  Essential hypertension S: controlled on amlodipine 29m and losartan  1061m  ASCVD 10 year risk calculation if age 822-79elevated but already on statin- working on weight loss for further recution BP Readings from Last 3 Encounters:  10/28/16 128/78  07/18/16 132/84  03/12/16 (!) 132/92  A/P: blood pressure goal of <140/90. Continue current medications  Obesity S: weight down 2 lbs from last visit but goal had been 15. Did not cu tdown on alcohol- still doing 15-16 beverages each weekend. Still smoking with the alcohol use about 10 a week. Did not reduce his diet coke intake- plan had been 50% reduction  Playing tennis on 4x a week basis (up from 3x a week) - pushing himself, getting good workout. Easily getting 150 minutes a week  Still very low on veggie intake- has done better with fruits  Doing better job with hydration other htan long tennis days A/P: Extended counseling needed. Motivation seems low to cut down drastically on eating out or alcohol intake but was able to negotiate the following "Continue tennis 4x a week  Reduce Beer Intake- to 8 a week   Reduce eating out- reduce eating out from 14 meals a week to 8. Make a meal at home or pack a lunch to help reduce your eating out intake.   See each other in October at our new site. I want a  weight update by mychart in 2 months. Would love to see at minimum 10 lbs off by follow up. "   October follow up  Return precautions advised.  Garret Reddish, MD

## 2016-10-28 NOTE — Assessment & Plan Note (Signed)
S: weight down 2 lbs from last visit but goal had been 15. Did not cu tdown on alcohol- still doing 15-16 beverages each weekend. Still smoking with the alcohol use about 10 a week. Did not reduce his diet coke intake- plan had been 50% reduction  Playing tennis on 4x a week basis (up from 3x a week) - pushing himself, getting good workout. Easily getting 150 minutes a week  Still very low on veggie intake- has done better with fruits  Doing better job with hydration other htan long tennis days A/P: Extended counseling needed. Motivation seems low to cut down drastically on eating out or alcohol intake but was able to negotiate the following "Continue tennis 4x a week  Reduce Beer Intake- to 8 a week   Reduce eating out- reduce eating out from 14 meals a week to 8. Make a meal at home or pack a lunch to help reduce your eating out intake.   See each other in October at our new site. I want a weight update by mychart in 2 months. Would love to see at minimum 10 lbs off by follow up. "

## 2016-10-28 NOTE — Assessment & Plan Note (Signed)
S: controlled on amlodipine 50m and losartan 1025m  ASCVD 10 year risk calculation if age 262-81elevated but already on statin- working on weight loss for further recution BP Readings from Last 3 Encounters:  10/28/16 128/78  07/18/16 132/84  03/12/16 (!) 132/92  A/P: blood pressure goal of <140/90. Continue current medications

## 2017-03-24 DIAGNOSIS — F331 Major depressive disorder, recurrent, moderate: Secondary | ICD-10-CM | POA: Diagnosis not present

## 2017-03-25 ENCOUNTER — Other Ambulatory Visit: Payer: Self-pay

## 2017-03-25 MED ORDER — SIMVASTATIN 20 MG PO TABS: 20.0000 mg | ORAL_TABLET | Freq: Every day | ORAL | 3 refills | Status: DC

## 2017-03-25 MED ORDER — AMLODIPINE BESYLATE 5 MG PO TABS: ORAL_TABLET | ORAL | 3 refills | Status: DC

## 2017-03-26 ENCOUNTER — Telehealth: Payer: Self-pay | Admitting: *Deleted

## 2017-03-26 NOTE — Telephone Encounter (Signed)
Patient's wife called stating the patient did not receive his losartan at the pharmacy with the other 2 medications that was refilled. Patient uses [facility] on [address]

## 2017-03-27 ENCOUNTER — Other Ambulatory Visit: Payer: Self-pay

## 2017-03-27 MED ORDER — LOSARTAN POTASSIUM 100 MG PO TABS: ORAL_TABLET | ORAL | 3 refills | Status: DC

## 2017-03-27 NOTE — Telephone Encounter (Signed)
Prescription sent to pharmacy.

## 2017-05-21 IMAGING — CT CT CERVICAL SPINE W/O CM
5 of 8 series · 12 of 33 positions shown, 13 images · non-contrast
Comparison: None.

CLINICAL DATA: 45-year-old male fell backwards while playing
tennis. Head injury. Headache. Neck Pain. Denies loss of
consciousness. Initial encounter.

EXAM:
CT HEAD WITHOUT CONTRAST
CT CERVICAL SPINE WITHOUT CONTRAST
TECHNIQUE: Multidetector CT imaging of the head and cervical spine was
performed following the standard protocol without intravenous
contrast. Multiplanar CT image reconstructions of the cervical spine
were also generated.

[Series 4: head bone · axial · 0.47mm/px · 1 of 87 slices shown]
[im 29/87  bone]
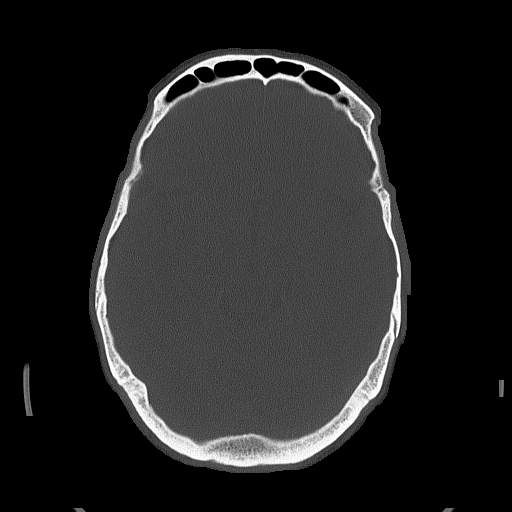

[Series 7: c_spine 2.0 st · axial · 0.32mm/px · z∈[-292,-180]mm · 3 of 114 slices shown, 4 images]
[im 29/114  soft-tissue]
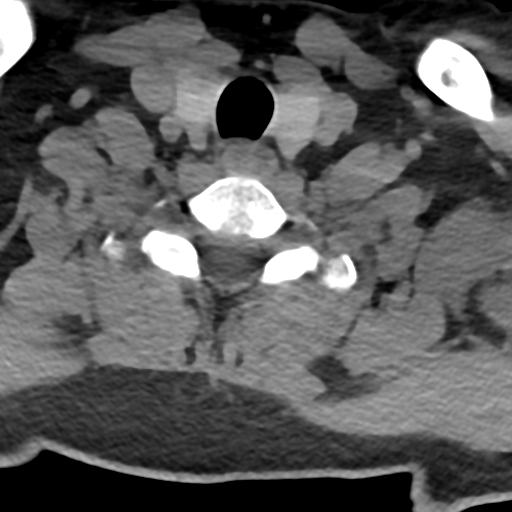
[im 29/114  bone]
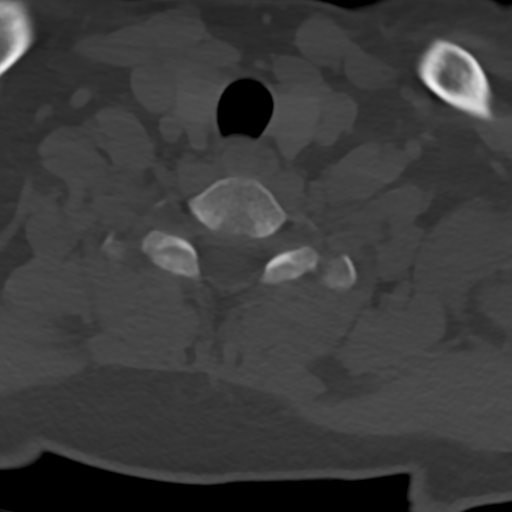
[im 57/114  bone]
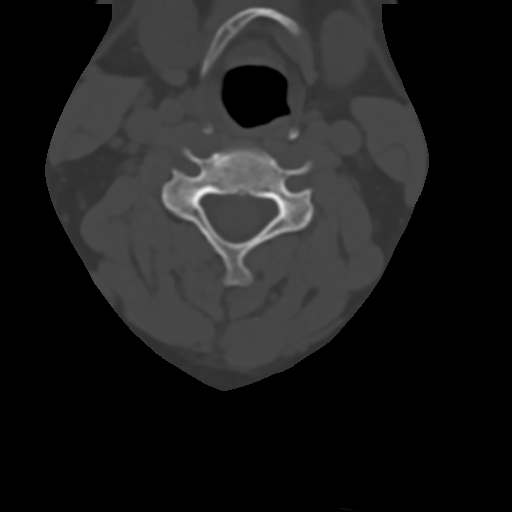
[im 85/114  bone]
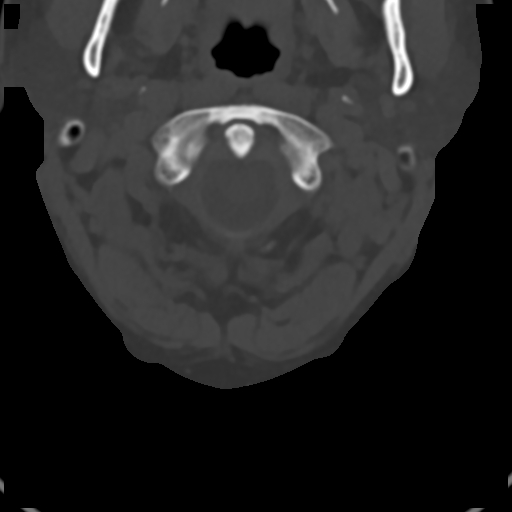

[Series 9: c_spine 2.0 sag bone · sagittal · 0.24mm/px · 4 of 61 slices shown]
[im 13/61  bone]
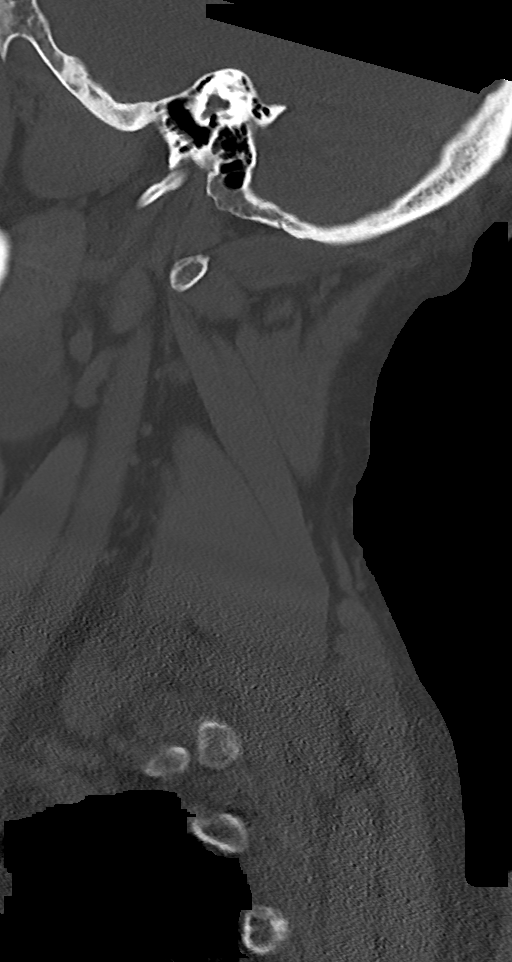
[im 25/61  bone]
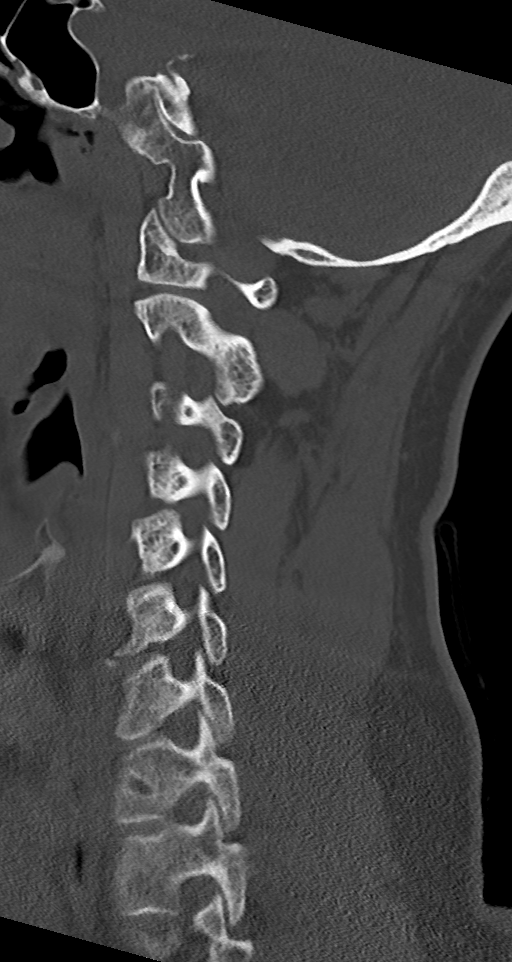
[im 37/61  bone]
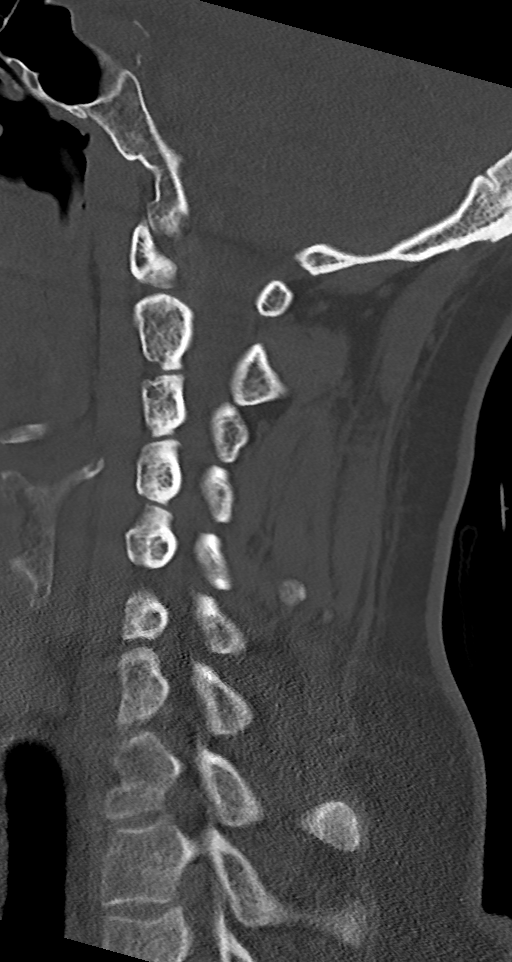
[im 49/61  bone]
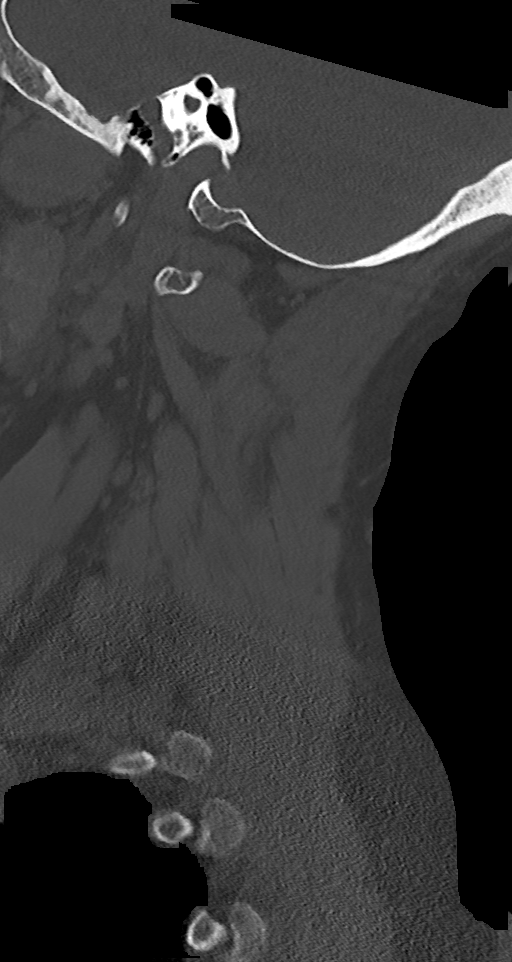

[Series 10: c_spine 2.0 cor bone · coronal · 0.23mm/px · 2 of 63 slices shown]
[im 21/63  bone]
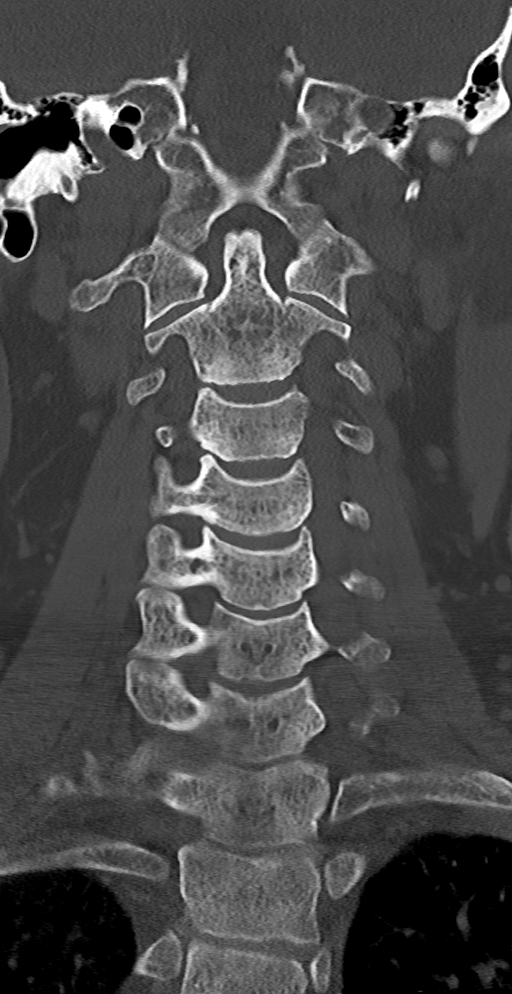
[im 42/63  bone]
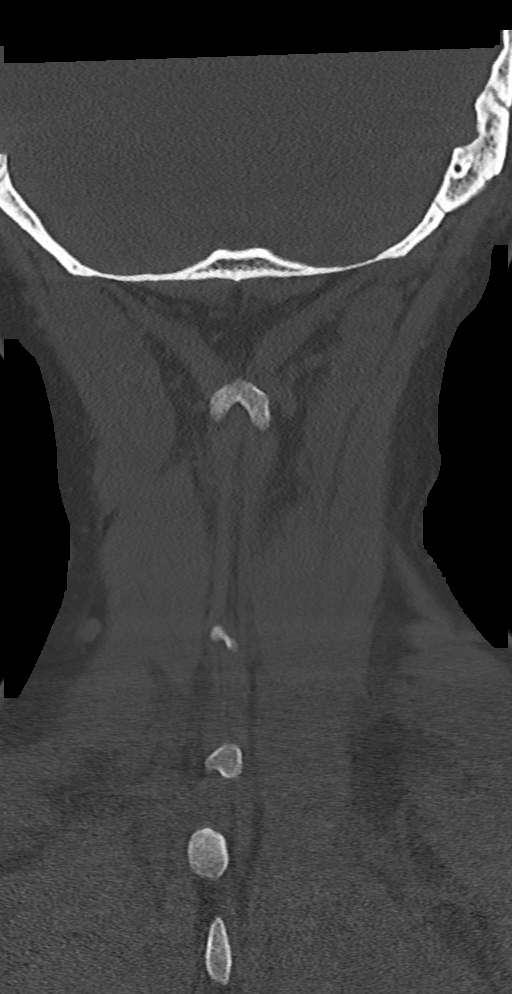

[Series 11: c_spine 2.0 orthogonals · axial · 0.21mm/px · z∈[-297,-235]mm · 2 of 99 slices shown]
[im 33/99  bone]
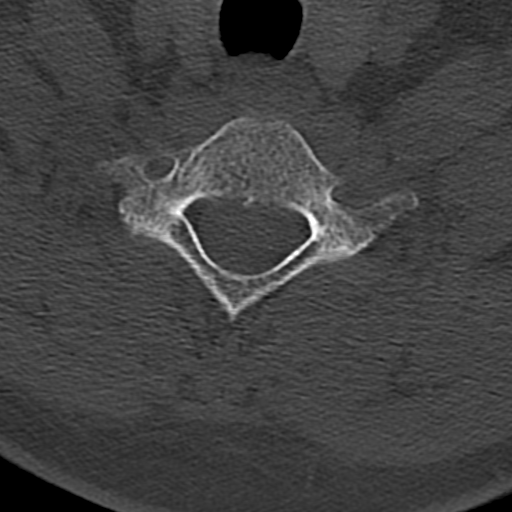
[im 66/99  bone]
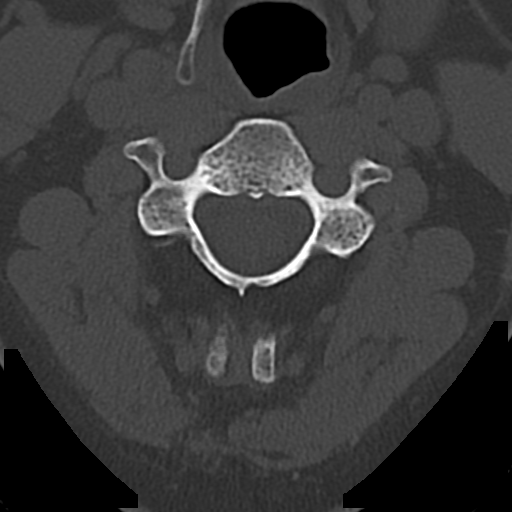

[12 of 33 positions shown; findings below may reference images not displayed]

FINDINGS: CT HEAD FINDINGS

Visualized paranasal sinuses and mastoids are well pneumatized. No
scalp hematoma identified. Negative orbits soft tissues. Calvarium
intact.

Possibly vascular related small linear hyperdensity near the left
frontal horn measuring 3-4 mm on series 3, image 15. For tiny
dystrophic calcification. Less likely this could be a tiny focus of
white matter shear injury. Elsewhere no evidence of acute
intracranial hemorrhage. No midline shift, ventriculomegaly, mass
effect, evidence of mass lesion, or evidence of cortically based
acute infarction. Gray-white matter differentiation is within normal
limits throughout the brain.

CT CERVICAL SPINE FINDINGS

Straightening of cervical lordosis. Visualized skull base is intact.
No atlanto-occipital dissociation. Cervicothoracic junction
alignment is within normal limits. Bilateral posterior element
alignment is within normal limits. Mild lower cervical disc and
endplate degeneration. No cervical spine fracture. Grossly intact
visualized upper thoracic levels. Negative lung apices. Negative
noncontrast neck soft tissues.
IMPRESSION: 1. No acute fracture or listhesis identified in the cervical spine.
Ligamentous injury is not excluded.
2. Probably small nontraumatic hyperdense focus in the left frontal
lobe white matter near the frontal horn. And while this appearance
might also be seen with white matter shear hemorrhage, the trauma
mechanism and clinical symptoms render that unlikely.
3. Otherwise negative noncontrast CT appearance of the brain.

## 2017-06-30 DIAGNOSIS — M25562 Pain in left knee: Secondary | ICD-10-CM | POA: Diagnosis not present

## 2017-06-30 DIAGNOSIS — M222X2 Patellofemoral disorders, left knee: Secondary | ICD-10-CM | POA: Diagnosis not present

## 2017-07-03 DIAGNOSIS — M25562 Pain in left knee: Secondary | ICD-10-CM | POA: Diagnosis not present

## 2017-07-03 DIAGNOSIS — M222X2 Patellofemoral disorders, left knee: Secondary | ICD-10-CM | POA: Diagnosis not present

## 2017-07-10 DIAGNOSIS — M222X2 Patellofemoral disorders, left knee: Secondary | ICD-10-CM | POA: Diagnosis not present

## 2017-07-10 DIAGNOSIS — M25562 Pain in left knee: Secondary | ICD-10-CM | POA: Diagnosis not present

## 2017-07-18 DIAGNOSIS — M25562 Pain in left knee: Secondary | ICD-10-CM | POA: Diagnosis not present

## 2017-07-18 DIAGNOSIS — M222X2 Patellofemoral disorders, left knee: Secondary | ICD-10-CM | POA: Diagnosis not present

## 2017-08-21 DIAGNOSIS — F331 Major depressive disorder, recurrent, moderate: Secondary | ICD-10-CM | POA: Diagnosis not present

## 2017-09-08 DIAGNOSIS — M25562 Pain in left knee: Secondary | ICD-10-CM | POA: Diagnosis not present

## 2017-10-18 DIAGNOSIS — M25562 Pain in left knee: Secondary | ICD-10-CM | POA: Diagnosis not present

## 2017-10-18 DIAGNOSIS — H66002 Acute suppurative otitis media without spontaneous rupture of ear drum, left ear: Secondary | ICD-10-CM | POA: Diagnosis not present

## 2017-10-22 DIAGNOSIS — M25562 Pain in left knee: Secondary | ICD-10-CM | POA: Diagnosis not present

## 2017-10-29 DIAGNOSIS — M25562 Pain in left knee: Secondary | ICD-10-CM | POA: Diagnosis not present

## 2017-10-29 DIAGNOSIS — M222X2 Patellofemoral disorders, left knee: Secondary | ICD-10-CM | POA: Diagnosis not present

## 2017-10-30 DIAGNOSIS — M79606 Pain in leg, unspecified: Secondary | ICD-10-CM | POA: Diagnosis not present

## 2017-10-30 DIAGNOSIS — F331 Major depressive disorder, recurrent, moderate: Secondary | ICD-10-CM | POA: Diagnosis not present

## 2017-10-31 DIAGNOSIS — M79662 Pain in left lower leg: Secondary | ICD-10-CM | POA: Diagnosis not present

## 2017-10-31 DIAGNOSIS — M25551 Pain in right hip: Secondary | ICD-10-CM | POA: Diagnosis not present

## 2017-10-31 DIAGNOSIS — M79661 Pain in right lower leg: Secondary | ICD-10-CM | POA: Diagnosis not present

## 2017-11-04 DIAGNOSIS — M25562 Pain in left knee: Secondary | ICD-10-CM | POA: Diagnosis not present

## 2017-11-04 DIAGNOSIS — M222X2 Patellofemoral disorders, left knee: Secondary | ICD-10-CM | POA: Diagnosis not present

## 2017-11-06 DIAGNOSIS — H6502 Acute serous otitis media, left ear: Secondary | ICD-10-CM | POA: Diagnosis not present

## 2017-11-11 DIAGNOSIS — M25562 Pain in left knee: Secondary | ICD-10-CM | POA: Diagnosis not present

## 2017-11-11 DIAGNOSIS — M222X2 Patellofemoral disorders, left knee: Secondary | ICD-10-CM | POA: Diagnosis not present

## 2017-11-13 DIAGNOSIS — M222X2 Patellofemoral disorders, left knee: Secondary | ICD-10-CM | POA: Diagnosis not present

## 2017-11-13 DIAGNOSIS — M25562 Pain in left knee: Secondary | ICD-10-CM | POA: Diagnosis not present

## 2017-11-17 ENCOUNTER — Encounter: Payer: Self-pay | Admitting: Family Medicine

## 2017-11-17 ENCOUNTER — Ambulatory Visit (INDEPENDENT_AMBULATORY_CARE_PROVIDER_SITE_OTHER): Payer: BLUE CROSS/BLUE SHIELD | Admitting: Family Medicine

## 2017-11-17 VITALS — BP 128/86 | HR 80 | Temp 97.6°F | Ht 72.0 in | Wt 256.0 lb

## 2017-11-17 DIAGNOSIS — I10 Essential (primary) hypertension: Secondary | ICD-10-CM

## 2017-11-17 DIAGNOSIS — E785 Hyperlipidemia, unspecified: Secondary | ICD-10-CM | POA: Diagnosis not present

## 2017-11-17 DIAGNOSIS — M5441 Lumbago with sciatica, right side: Secondary | ICD-10-CM | POA: Diagnosis not present

## 2017-11-17 DIAGNOSIS — M5442 Lumbago with sciatica, left side: Secondary | ICD-10-CM | POA: Diagnosis not present

## 2017-11-17 DIAGNOSIS — R319 Hematuria, unspecified: Secondary | ICD-10-CM | POA: Diagnosis not present

## 2017-11-17 DIAGNOSIS — Z87891 Personal history of nicotine dependence: Secondary | ICD-10-CM | POA: Diagnosis not present

## 2017-11-17 DIAGNOSIS — G8929 Other chronic pain: Secondary | ICD-10-CM

## 2017-11-17 DIAGNOSIS — R3912 Poor urinary stream: Secondary | ICD-10-CM

## 2017-11-17 DIAGNOSIS — E669 Obesity, unspecified: Secondary | ICD-10-CM

## 2017-11-17 DIAGNOSIS — E66811 Obesity, class 1: Secondary | ICD-10-CM

## 2017-11-17 LAB — CBC
HEMATOCRIT: 44.2 % (ref 39.0–52.0)
HEMOGLOBIN: 15.5 g/dL (ref 13.0–17.0)
MCHC: 35.2 g/dL (ref 30.0–36.0)
MCV: 89.8 fl (ref 78.0–100.0)
PLATELETS: 257 10*3/uL (ref 150.0–400.0)
RBC: 4.92 Mil/uL (ref 4.22–5.81)
RDW: 13 % (ref 11.5–15.5)
WBC: 5.9 10*3/uL (ref 4.0–10.5)

## 2017-11-17 LAB — URINALYSIS, MICROSCOPIC ONLY

## 2017-11-17 LAB — LIPID PANEL
CHOLESTEROL: 136 mg/dL (ref 0–200)
HDL: 34.2 mg/dL — ABNORMAL LOW (ref >39.00)
LDL CALC: 79 mg/dL (ref 0–99)
NonHDL: 101.98
TRIGLYCERIDES: 115 mg/dL (ref 0.0–149.0)
Total CHOL/HDL Ratio: 4
VLDL: 23 mg/dL (ref 0.0–40.0)

## 2017-11-17 LAB — POC URINALSYSI DIPSTICK (AUTOMATED)
Bilirubin, UA: NEGATIVE
Glucose, UA: NEGATIVE
LEUKOCYTES UA: NEGATIVE
NITRITE UA: NEGATIVE
PH UA: 6 (ref 5.0–8.0)
PROTEIN UA: NEGATIVE
Spec Grav, UA: 1.02 (ref 1.010–1.025)
UROBILINOGEN UA: 1 U/dL

## 2017-11-17 LAB — COMPREHENSIVE METABOLIC PANEL
ALK PHOS: 72 U/L (ref 39–117)
ALT: 29 U/L (ref 0–53)
AST: 18 U/L (ref 0–37)
Albumin: 4.6 g/dL (ref 3.5–5.2)
BUN: 12 mg/dL (ref 6–23)
CALCIUM: 9.9 mg/dL (ref 8.4–10.5)
CHLORIDE: 103 meq/L (ref 96–112)
CO2: 29 mEq/L (ref 19–32)
Creatinine, Ser: 1.09 mg/dL (ref 0.40–1.50)
GFR: 77.72 mL/min (ref >60.00)
Glucose, Bld: 100 mg/dL — ABNORMAL HIGH (ref 70–99)
Potassium: 4 mEq/L (ref 3.5–5.1)
SODIUM: 141 meq/L (ref 135–145)
TOTAL PROTEIN: 7.2 g/dL (ref 6.0–8.3)
Total Bilirubin: 0.6 mg/dL (ref 0.2–1.2)

## 2017-11-17 LAB — PSA: PSA: 0.6 ng/mL (ref 0.10–4.00)

## 2017-11-17 NOTE — Assessment & Plan Note (Signed)
S:  controlled on simvastatin 20 mg. More consistent this year with meds Lab Results  Component Value Date   CHOL 177 07/11/2016   HDL 47.90 07/11/2016   LDLCALC 110 (H) 07/11/2016   LDLDIRECT 113.3 02/21/2014   TRIG 96.0 07/11/2016   CHOLHDL 4 07/11/2016   A/P: Update lipid panel today-patient has been more consistent with medications plus he has made some lifestyle changes-hoping LDL under 100

## 2017-11-17 NOTE — Patient Instructions (Signed)
Great job with healthy lifestyle changes!  Keep up the good work.  I would target about a pound a week weight loss- if you go past this that is great.  Try for at least 220 or 230 by January 2019  Lets check in 2 to 6 months from now for a physical-please schedule before you leave today and make sure they put in a 30-minute slot.  Please stop by lab before you go

## 2017-11-17 NOTE — Assessment & Plan Note (Signed)
S:3. Feels like stream weaker and has worsened in last year- did PSA this time last year.  Lab Results  Component Value Date   PSA 0.61 07/11/2016  A/P: We will get another PSA and urine today.  I will refer him to urology as cause unclear of weekeaned stream

## 2017-11-17 NOTE — Progress Notes (Signed)
Subjective:  Henry Houston is a 47 y.o. year old very pleasant male patient who presents for/with See problem oriented charting ROS-weak urinary stream, no chest pain or shortness of breath reported.  No edema.  Past Medical History-  Patient Active Problem List   Diagnosis Date Noted  . Former smoker 03/12/2016    Priority: High  . Weak urinary stream 11/17/2017    Priority: Medium  . GAD (generalized anxiety disorder) 05/26/2007    Priority: Medium  . Hyperlipidemia 04/16/2007    Priority: Medium  . Essential hypertension 04/16/2007    Priority: Medium  . Obesity (BMI 30.0-34.9) 04/16/2007    Priority: Low  . DEVIATED SEPTUM 04/16/2007    Priority: Low  . GERD 04/16/2007    Priority: Low  . Low back pain 07/26/2011    Medications- reviewed and updated Current Outpatient Medications  Medication Sig Dispense Refill  . amLODipine (NORVASC) 5 MG tablet TAKE 1 TABLET BY MOUTH DAILY. 91 tablet 3  . buPROPion (WELLBUTRIN XL) 150 MG 24 hr tablet Take 150 mg by mouth daily.     . clonazePAM (KLONOPIN) 0.5 MG tablet Take 0.5 mg by mouth as needed.     [name] losartan (COZAAR) 100 MG tablet TAKE 1 TABLET (100 MG TOTAL) BY MOUTH DAILY. 91 tablet 3  . simvastatin (ZOCOR) 20 MG tablet Take 1 tablet (20 mg total) by mouth at bedtime. 91 tablet 3   No current facility-administered medications for this visit.     Objective: BP 128/86 (BP Location: Left Arm, Patient Position: Sitting, Cuff Size: Large)   Pulse 80   Temp 97.6 F (36.4 C) (Oral)   Ht 6' (1.829 m)   Wt 256 lb (116.1 kg)   SpO2 97%   BMI 34.72 kg/m  Gen: NAD, resting comfortably Oropharynx normal, mildly erythematous nasal turbinates CV: RRR no murmurs rubs or gallops Lungs: CTAB no crackles, wheeze, rhonchi Abdomen: soft/nontender/nondistended/normal bowel sounds.  Obese but improved Ext: no edema Skin: warm, dry  Assessment/Plan:  Other notes: 1.  Continues to see psychiatry for generalized anxiety disorder-on  Wellbutrin and clonazepam  Essential hypertension S: controlled on amlodipine 5 mg and losartan 100 mg BP Readings from Last 3 Encounters:  11/17/17 128/86  10/28/16 128/78  07/18/16 132/84  A/P: We discussed blood pressure goal of <140/90. Continue current meds  Hyperlipidemia S:  controlled on simvastatin 20 mg. More consistent this year with meds Lab Results  Component Value Date   CHOL 177 07/11/2016   HDL 47.90 07/11/2016   LDLCALC 110 (H) 07/11/2016   LDLDIRECT 113.3 02/21/2014   TRIG 96.0 07/11/2016   CHOLHDL 4 07/11/2016   A/P: Update lipid panel today-patient has been more consistent with medications plus he has made some lifestyle changes-hoping LDL under 100  Weak urinary stream S:3. Feels like stream weaker and has worsened in last year- did PSA this time last year.  Lab Results  Component Value Date   PSA 0.61 07/11/2016  A/P: We will get another PSA and urine today.  I will refer him to urology as cause unclear of weekeaned stream   Former smoker Quit April 2019.  I congratulated patient! he has cut down on his alcohol intake which was usually when he would smoke.  Will check in next visit  Low back pain S: Sitting down for long periods- hips get tight, occasional tingilng in calves in both sides. Better with walking- several years of issues. Not worsening.  A/P: Stable for years-we discussed getting films  if were to worsen.  He agrees.  He may also discuss with his orthopedist   Obesity (BMI 30.0-34.9) Down 13 lbs in last month- down 11 lbs from last year. Doing 1800 calories a day. Exercising but limited due to left knee pain- seeing [place] [facility] Dr. Norm Houston- has been walking for most part but had been more active prior playing tennis.  Congratulated patient!  Set a goal of 2 22-35 in the year.  Physical within 2 to 6 months to check in on weight.  He wanted to be more aggressive with weight goal 200 by the end of the year-told him it is fine if he gets  there but just do not want him to be discouraged so we set slightly softer goal.  2 to 6 months physical Labs today- will likely not need with physical Lab/Order associations: Essential hypertension - Plan: Lipid panel, CBC, Comprehensive metabolic panel  Hyperlipidemia, unspecified hyperlipidemia type - Plan: Lipid panel, CBC, Comprehensive metabolic panel  Weak urinary stream - Plan: POCT Urinalysis Dipstick (Automated), PSA, Ambulatory referral to Urology  Former smoker - Plan: POCT Urinalysis Dipstick (Automated)  Return precautions advised.  Garret Reddish, MD

## 2017-11-17 NOTE — Assessment & Plan Note (Signed)
S: controlled on amlodipine 5 mg and losartan 100 mg BP Readings from Last 3 Encounters:  11/17/17 128/86  10/28/16 128/78  07/18/16 132/84  A/P: We discussed blood pressure goal of <140/90. Continue current meds

## 2017-11-17 NOTE — Addendum Note (Signed)
Addended by: Kayren Eaves T on: 11/17/2017 09:32 AM   Modules accepted: Orders

## 2017-11-17 NOTE — Addendum Note (Signed)
Addended by: Lucianne Lei M on: 11/17/2017 10:17 AM   Modules accepted: Orders

## 2017-11-17 NOTE — Assessment & Plan Note (Signed)
Down 13 lbs in last month- down 11 lbs from last year. Doing 1800 calories a day. Exercising but limited due to left knee pain- seeing [facility] Dr. Norm Salt- has been walking for most part but had been more active prior playing tennis.  Congratulated patient!  Set a goal of 2 22-35 in the year.  Physical within 2 to 6 months to check in on weight.  He wanted to be more aggressive with weight goal 200 by the end of the year-told him it is fine if he gets there but just do not want him to be discouraged so we set slightly softer goal.

## 2017-11-17 NOTE — Progress Notes (Signed)
Your CBC was normal (blood counts, infection fighting cells, platelets). Your CMET was normal (kidney, liver, and electrolytes)  except for blood sugar. At risk for diabetes with blood sugar 100 but on low end of at risk range (at risk from 100-125). Healthy eating, regular exercise, weight loss advised- but we know you are working hard on those.  Your PSA trend was low risk for prostate cancer Your cholesterol looks much better this year-continue the great job with weight loss. Possible blood in your initial urine test-we did a more accurate test which showed no blood.

## 2017-11-17 NOTE — Assessment & Plan Note (Signed)
S: Sitting down for long periods- hips get tight, occasional tingilng in calves in both sides. Better with walking- several years of issues. Not worsening.  A/P: Stable for years-we discussed getting films if were to worsen.  He agrees.  He may also discuss with his orthopedist

## 2017-11-17 NOTE — Assessment & Plan Note (Signed)
Quit April 2019.  I congratulated patient! he has cut down on his alcohol intake which was usually when he would smoke.  Will check in next visit

## 2017-11-17 NOTE — Addendum Note (Signed)
Addended by: Kayren Eaves T on: 11/17/2017 10:23 AM   Modules accepted: Orders

## 2017-11-18 DIAGNOSIS — M222X2 Patellofemoral disorders, left knee: Secondary | ICD-10-CM | POA: Diagnosis not present

## 2017-11-18 DIAGNOSIS — M25562 Pain in left knee: Secondary | ICD-10-CM | POA: Diagnosis not present

## 2017-11-20 DIAGNOSIS — M222X2 Patellofemoral disorders, left knee: Secondary | ICD-10-CM | POA: Diagnosis not present

## 2017-11-20 DIAGNOSIS — M25562 Pain in left knee: Secondary | ICD-10-CM | POA: Diagnosis not present

## 2017-11-24 DIAGNOSIS — N401 Enlarged prostate with lower urinary tract symptoms: Secondary | ICD-10-CM | POA: Diagnosis not present

## 2017-11-24 DIAGNOSIS — R3912 Poor urinary stream: Secondary | ICD-10-CM | POA: Diagnosis not present

## 2017-11-25 DIAGNOSIS — M222X2 Patellofemoral disorders, left knee: Secondary | ICD-10-CM | POA: Diagnosis not present

## 2017-11-25 DIAGNOSIS — M25562 Pain in left knee: Secondary | ICD-10-CM | POA: Diagnosis not present

## 2017-12-03 DIAGNOSIS — M222X2 Patellofemoral disorders, left knee: Secondary | ICD-10-CM | POA: Diagnosis not present

## 2017-12-03 DIAGNOSIS — M25562 Pain in left knee: Secondary | ICD-10-CM | POA: Diagnosis not present

## 2017-12-08 DIAGNOSIS — F331 Major depressive disorder, recurrent, moderate: Secondary | ICD-10-CM | POA: Diagnosis not present

## 2017-12-12 ENCOUNTER — Ambulatory Visit: Payer: BLUE CROSS/BLUE SHIELD | Admitting: Psychology

## 2017-12-18 DIAGNOSIS — F331 Major depressive disorder, recurrent, moderate: Secondary | ICD-10-CM | POA: Diagnosis not present

## 2017-12-18 DIAGNOSIS — F411 Generalized anxiety disorder: Secondary | ICD-10-CM | POA: Diagnosis not present

## 2017-12-22 DIAGNOSIS — F411 Generalized anxiety disorder: Secondary | ICD-10-CM | POA: Diagnosis not present

## 2017-12-22 DIAGNOSIS — F331 Major depressive disorder, recurrent, moderate: Secondary | ICD-10-CM | POA: Diagnosis not present

## 2017-12-29 DIAGNOSIS — F331 Major depressive disorder, recurrent, moderate: Secondary | ICD-10-CM | POA: Diagnosis not present

## 2017-12-29 DIAGNOSIS — F411 Generalized anxiety disorder: Secondary | ICD-10-CM | POA: Diagnosis not present

## 2018-01-05 DIAGNOSIS — F331 Major depressive disorder, recurrent, moderate: Secondary | ICD-10-CM | POA: Diagnosis not present

## 2018-01-05 DIAGNOSIS — F411 Generalized anxiety disorder: Secondary | ICD-10-CM | POA: Diagnosis not present

## 2018-01-08 DIAGNOSIS — F331 Major depressive disorder, recurrent, moderate: Secondary | ICD-10-CM | POA: Diagnosis not present

## 2018-01-12 DIAGNOSIS — F411 Generalized anxiety disorder: Secondary | ICD-10-CM | POA: Diagnosis not present

## 2018-01-12 DIAGNOSIS — F331 Major depressive disorder, recurrent, moderate: Secondary | ICD-10-CM | POA: Diagnosis not present

## 2018-01-19 DIAGNOSIS — F331 Major depressive disorder, recurrent, moderate: Secondary | ICD-10-CM | POA: Diagnosis not present

## 2018-01-19 DIAGNOSIS — F411 Generalized anxiety disorder: Secondary | ICD-10-CM | POA: Diagnosis not present

## 2018-01-28 DIAGNOSIS — F411 Generalized anxiety disorder: Secondary | ICD-10-CM | POA: Diagnosis not present

## 2018-01-28 DIAGNOSIS — F331 Major depressive disorder, recurrent, moderate: Secondary | ICD-10-CM | POA: Diagnosis not present

## 2018-02-02 DIAGNOSIS — F331 Major depressive disorder, recurrent, moderate: Secondary | ICD-10-CM | POA: Diagnosis not present

## 2018-02-02 DIAGNOSIS — F411 Generalized anxiety disorder: Secondary | ICD-10-CM | POA: Diagnosis not present

## 2018-02-11 DIAGNOSIS — F331 Major depressive disorder, recurrent, moderate: Secondary | ICD-10-CM | POA: Diagnosis not present

## 2018-02-11 DIAGNOSIS — F411 Generalized anxiety disorder: Secondary | ICD-10-CM | POA: Diagnosis not present

## 2018-02-11 DIAGNOSIS — F410 Panic disorder [episodic paroxysmal anxiety] without agoraphobia: Secondary | ICD-10-CM | POA: Diagnosis not present

## 2018-02-16 DIAGNOSIS — F411 Generalized anxiety disorder: Secondary | ICD-10-CM | POA: Diagnosis not present

## 2018-02-16 DIAGNOSIS — F410 Panic disorder [episodic paroxysmal anxiety] without agoraphobia: Secondary | ICD-10-CM | POA: Diagnosis not present

## 2018-02-16 DIAGNOSIS — F331 Major depressive disorder, recurrent, moderate: Secondary | ICD-10-CM | POA: Diagnosis not present

## 2018-02-20 ENCOUNTER — Ambulatory Visit (INDEPENDENT_AMBULATORY_CARE_PROVIDER_SITE_OTHER): Payer: BLUE CROSS/BLUE SHIELD | Admitting: Family Medicine

## 2018-02-20 ENCOUNTER — Encounter: Payer: Self-pay | Admitting: Family Medicine

## 2018-02-20 VITALS — BP 132/88 | HR 94 | Temp 97.8°F | Ht 72.0 in | Wt 237.4 lb

## 2018-02-20 DIAGNOSIS — E785 Hyperlipidemia, unspecified: Secondary | ICD-10-CM

## 2018-02-20 DIAGNOSIS — I10 Essential (primary) hypertension: Secondary | ICD-10-CM | POA: Diagnosis not present

## 2018-02-20 DIAGNOSIS — Z0000 Encounter for general adult medical examination without abnormal findings: Secondary | ICD-10-CM | POA: Diagnosis not present

## 2018-02-20 DIAGNOSIS — F411 Generalized anxiety disorder: Secondary | ICD-10-CM
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
## 2018-02-20 NOTE — Patient Instructions (Addendum)
Health Maintenance Due  Topic Date Due  . INFLUENZA VACCINE - let us know when you get this 01/08/2018   Physical 1 year as long as BP remains <140/90 at home  Sorry for how things are going on the home front- and hope things get better  If you want to check in 6 months from now we certainly can- just let us know

## 2018-02-23 DIAGNOSIS — F331 Major depressive disorder, recurrent, moderate: Secondary | ICD-10-CM | POA: Diagnosis not present

## 2018-02-23 DIAGNOSIS — F410 Panic disorder [episodic paroxysmal anxiety] without agoraphobia: Secondary | ICD-10-CM | POA: Diagnosis not present

## 2018-02-23 DIAGNOSIS — F411 Generalized anxiety disorder: Secondary | ICD-10-CM | POA: Diagnosis not present

## 2018-02-25 DIAGNOSIS — F331 Major depressive disorder, recurrent, moderate: Secondary | ICD-10-CM | POA: Diagnosis not present

## 2018-02-27 DIAGNOSIS — R3912 Poor urinary stream: Secondary | ICD-10-CM | POA: Diagnosis not present

## 2018-02-27 DIAGNOSIS — F419 Anxiety disorder, unspecified: Secondary | ICD-10-CM | POA: Diagnosis not present

## 2018-03-04 DIAGNOSIS — F410 Panic disorder [episodic paroxysmal anxiety] without agoraphobia: Secondary | ICD-10-CM | POA: Diagnosis not present

## 2018-03-04 DIAGNOSIS — F331 Major depressive disorder, recurrent, moderate: Secondary | ICD-10-CM | POA: Diagnosis not present

## 2018-03-04 DIAGNOSIS — F411 Generalized anxiety disorder: Secondary | ICD-10-CM | POA: Diagnosis not present

## 2018-03-09 DIAGNOSIS — F331 Major depressive disorder, recurrent, moderate: Secondary | ICD-10-CM | POA: Diagnosis not present

## 2018-03-09 DIAGNOSIS — F411 Generalized anxiety disorder: Secondary | ICD-10-CM | POA: Diagnosis not present

## 2018-03-09 DIAGNOSIS — F410 Panic disorder [episodic paroxysmal anxiety] without agoraphobia: Secondary | ICD-10-CM | POA: Diagnosis not present

## 2018-03-16 DIAGNOSIS — F331 Major depressive disorder, recurrent, moderate: Secondary | ICD-10-CM | POA: Diagnosis not present

## 2018-03-16 DIAGNOSIS — F411 Generalized anxiety disorder: Secondary | ICD-10-CM | POA: Diagnosis not present

## 2018-03-16 DIAGNOSIS — F410 Panic disorder [episodic paroxysmal anxiety] without agoraphobia: Secondary | ICD-10-CM | POA: Diagnosis not present

## 2018-03-23 DIAGNOSIS — F411 Generalized anxiety disorder: Secondary | ICD-10-CM | POA: Diagnosis not present

## 2018-03-23 DIAGNOSIS — F331 Major depressive disorder, recurrent, moderate: Secondary | ICD-10-CM | POA: Diagnosis not present

## 2018-03-23 DIAGNOSIS — F410 Panic disorder [episodic paroxysmal anxiety] without agoraphobia: Secondary | ICD-10-CM | POA: Diagnosis not present

## 2018-03-25 ENCOUNTER — Other Ambulatory Visit: Payer: Self-pay

## 2018-03-25 MED ORDER — AMLODIPINE BESYLATE 5 MG PO TABS: ORAL_TABLET | ORAL | 3 refills | Status: DC

## 2018-03-25 MED ORDER — LOSARTAN POTASSIUM 100 MG PO TABS: ORAL_TABLET | ORAL | 3 refills | Status: DC

## 2018-03-25 MED ORDER — SIMVASTATIN 20 MG PO TABS: 20.0000 mg | ORAL_TABLET | Freq: Every day | ORAL | 3 refills | Status: DC

## 2018-04-06 DIAGNOSIS — F410 Panic disorder [episodic paroxysmal anxiety] without agoraphobia: Secondary | ICD-10-CM | POA: Diagnosis not present

## 2018-04-06 DIAGNOSIS — F331 Major depressive disorder, recurrent, moderate: Secondary | ICD-10-CM | POA: Diagnosis not present

## 2018-04-06 DIAGNOSIS — F411 Generalized anxiety disorder: Secondary | ICD-10-CM | POA: Diagnosis not present

## 2018-04-13 DIAGNOSIS — F331 Major depressive disorder, recurrent, moderate: Secondary | ICD-10-CM | POA: Diagnosis not present

## 2018-04-13 DIAGNOSIS — F410 Panic disorder [episodic paroxysmal anxiety] without agoraphobia: Secondary | ICD-10-CM | POA: Diagnosis not present

## 2018-04-13 DIAGNOSIS — F411 Generalized anxiety disorder: Secondary | ICD-10-CM | POA: Diagnosis not present

## 2018-04-15 ENCOUNTER — Encounter: Payer: Self-pay | Admitting: Family Medicine

## 2018-04-20 DIAGNOSIS — F411 Generalized anxiety disorder: Secondary | ICD-10-CM | POA: Diagnosis not present

## 2018-04-20 DIAGNOSIS — F410 Panic disorder [episodic paroxysmal anxiety] without agoraphobia: Secondary | ICD-10-CM | POA: Diagnosis not present

## 2018-04-20 DIAGNOSIS — F331 Major depressive disorder, recurrent, moderate: Secondary | ICD-10-CM | POA: Diagnosis not present

## 2018-04-27 DIAGNOSIS — F411 Generalized anxiety disorder: Secondary | ICD-10-CM | POA: Diagnosis not present

## 2018-04-27 DIAGNOSIS — F410 Panic disorder [episodic paroxysmal anxiety] without agoraphobia: Secondary | ICD-10-CM | POA: Diagnosis not present

## 2018-04-27 DIAGNOSIS — F331 Major depressive disorder, recurrent, moderate: Secondary | ICD-10-CM | POA: Diagnosis not present

## 2018-04-30 DIAGNOSIS — F331 Major depressive disorder, recurrent, moderate: Secondary | ICD-10-CM | POA: Diagnosis not present

## 2018-05-05 DIAGNOSIS — F331 Major depressive disorder, recurrent, moderate: Secondary | ICD-10-CM | POA: Diagnosis not present

## 2018-05-05 DIAGNOSIS — F410 Panic disorder [episodic paroxysmal anxiety] without agoraphobia: Secondary | ICD-10-CM | POA: Diagnosis not present

## 2018-05-05 DIAGNOSIS — F411 Generalized anxiety disorder: Secondary | ICD-10-CM | POA: Diagnosis not present

## 2018-05-11 DIAGNOSIS — F331 Major depressive disorder, recurrent, moderate: Secondary | ICD-10-CM | POA: Diagnosis not present

## 2018-05-11 DIAGNOSIS — F411 Generalized anxiety disorder: Secondary | ICD-10-CM | POA: Diagnosis not present

## 2018-05-11 DIAGNOSIS — F410 Panic disorder [episodic paroxysmal anxiety] without agoraphobia: Secondary | ICD-10-CM | POA: Diagnosis not present

## 2018-05-19 DIAGNOSIS — F331 Major depressive disorder, recurrent, moderate: Secondary | ICD-10-CM | POA: Diagnosis not present

## 2018-05-19 DIAGNOSIS — F411 Generalized anxiety disorder: Secondary | ICD-10-CM | POA: Diagnosis not present

## 2018-05-19 DIAGNOSIS — F410 Panic disorder [episodic paroxysmal anxiety] without agoraphobia: Secondary | ICD-10-CM | POA: Diagnosis not present

## 2018-05-27 DIAGNOSIS — F410 Panic disorder [episodic paroxysmal anxiety] without agoraphobia: Secondary | ICD-10-CM | POA: Diagnosis not present

## 2018-05-27 DIAGNOSIS — F411 Generalized anxiety disorder: Secondary | ICD-10-CM | POA: Diagnosis not present

## 2018-05-27 DIAGNOSIS — F331 Major depressive disorder, recurrent, moderate: Secondary | ICD-10-CM | POA: Diagnosis not present

## 2018-08-19 ENCOUNTER — Other Ambulatory Visit: Payer: Self-pay | Admitting: Family Medicine

## 2018-08-26 ENCOUNTER — Other Ambulatory Visit: Payer: Self-pay | Admitting: Family Medicine

## 2018-08-26 ENCOUNTER — Encounter: Payer: Self-pay | Admitting: Family Medicine

## 2018-08-26 MED ORDER — OLMESARTAN MEDOXOMIL 20 MG PO TABS: 20.0000 mg | ORAL_TABLET | Freq: Every day | ORAL | 3 refills | Status: DC

## 2018-08-26 NOTE — Telephone Encounter (Signed)
Dr. Yong Channel, please clarify directions for Olmesartan it was not put on the Rx.

## 2018-09-16 ENCOUNTER — Telehealth: Payer: Self-pay

## 2018-09-16 NOTE — Telephone Encounter (Signed)
Per pharmacy Losartan on back order. Rx for Olmesartan 40 mg sent pharmacy per PCP verbal request. Pt notified of update. Pt stated that someone has already sent in 20 mg. After speaking with PCP he wanted to know pt current Bp readings. If pt Bp reading are in Normal ranges then pt can continue the 20 mg of Olmeartan. Pt stated that he already took 2 tablets after me telling him that we sent in 40 mg. Pt stated that it would be nice to take 20 mg. I asked pt if he noticed a change in sx related to Bp and he denies. Pt was advised to take 1 tablet for a couple of days while checking Bp readings and give [place] his Bp readings on Monday. Pt verbalized understanding. Pt was advised if anything change regarding his Bp before then to call our office back.

## 2018-09-23 ENCOUNTER — Other Ambulatory Visit: Payer: Self-pay

## 2018-09-23 ENCOUNTER — Encounter: Payer: Self-pay | Admitting: Family Medicine

## 2018-09-23 MED ORDER — OLMESARTAN MEDOXOMIL 40 MG PO TABS: 40.0000 mg | ORAL_TABLET | Freq: Every day | ORAL | 3 refills | Status: DC

## 2018-09-23 MED ORDER — OLMESARTAN MEDOXOMIL 20 MG PO TABS: 40.0000 mg | ORAL_TABLET | Freq: Every day | ORAL | 3 refills | Status: DC

## 2018-09-23 NOTE — Telephone Encounter (Signed)
Please see message and advise.  Thank you. ° °

## 2018-09-29 ENCOUNTER — Encounter: Payer: Self-pay | Admitting: Family Medicine

## 2018-09-30 ENCOUNTER — Encounter: Payer: Self-pay | Admitting: Family Medicine

## 2018-09-30 ENCOUNTER — Ambulatory Visit (INDEPENDENT_AMBULATORY_CARE_PROVIDER_SITE_OTHER): Payer: Commercial Managed Care - PPO | Admitting: Family Medicine

## 2018-09-30 VITALS — BP 130/80 | Ht 72.0 in | Wt 240.0 lb

## 2018-09-30 DIAGNOSIS — Z6832 Body mass index (BMI) 32.0-32.9, adult: Secondary | ICD-10-CM

## 2018-09-30 DIAGNOSIS — F411 Generalized anxiety disorder: Secondary | ICD-10-CM | POA: Diagnosis not present

## 2018-09-30 DIAGNOSIS — E669 Obesity, unspecified: Secondary | ICD-10-CM

## 2018-09-30 DIAGNOSIS — E785 Hyperlipidemia, unspecified: Secondary | ICD-10-CM | POA: Diagnosis not present

## 2018-09-30 DIAGNOSIS — I10 Essential (primary) hypertension: Secondary | ICD-10-CM | POA: Diagnosis not present

## 2018-09-30 NOTE — Patient Instructions (Addendum)
Video visit

## 2018-09-30 NOTE — Assessment & Plan Note (Signed)
S:up 5-6 lbs through winter and being more cooped up with covid 19. Thinks he needs to cut down on the portion size. Doing tennis 4-5 times a week. Goal weight 190.  A/P: Patient made tremendous strides previously- I suggested by follow-up in September that he target 10 to 15 pounds off-I believe he can achieve this goal.  Doing a great job with exercise he just needs to improve on the diet-we discussed/counseled him on this.

## 2018-09-30 NOTE — Assessment & Plan Note (Signed)
S: controlled on olmesartan 22m (changed from losartan 100 mg) and amlodipine 542mOn 2016mas getting more low 140s.  BP Readings from Last 3 Encounters:  09/30/18 130/80  02/20/18 132/88  11/17/17 128/86  A/P:  Stable. Continue current medications.

## 2018-09-30 NOTE — Assessment & Plan Note (Signed)
S: He remains on venlafaxine and Wellbutrin as well as Ambien through psychiatry.  feels anxiety has been much better lately. Separation not going well- tried for 5-6 months but wife just not interested in reconciling-he is finally coming to accept this new reality.  Mild PHQ 9 elevation at 7 today but a lot of this is related to current COVID-19 pandemic  For insomnia-Takes ambien 4x a week.  Also sparingly uses Klonopin for anxiety  A/P: Stable problem-continue psychiatry follow-up.

## 2018-09-30 NOTE — Progress Notes (Signed)
Phone [phone]   Subjective:  Virtual visit via Video note. Chief complaint: Chief Complaint  Patient presents with  . Follow-up  . Hypertension    Checks BP occasionally. Tkaing Amlodipine and Olmesartan.   [name] Hyperlipidemia    Taking Simvastatin, no side effects.   . Anxiety    Taking Bupropion and Venlafaxine, also Clonazepam prn.   . Insomnia    Taking Zolpidem occasionally.     This visit type was conducted due to national recommendations for restrictions regarding the COVID-19 Pandemic (e.g. social distancing).  This format is felt to be most appropriate for this patient at this time balancing risks to patient and risks to population by having him in for in person visit.  No physical exam was performed (except for noted visual exam or audio findings with Telehealth visits).    Our team/I connected with Lorella Nimrod on 09/30/18 at  8:40 AM EDT by a video enabled telemedicine application ([email or web address]) and verified that I am speaking with the correct person using two identifiers.  Location patient: [place] Location provider: [facility], office Persons participating in the virtual visit:  patient  Our team/I discussed the limitations of evaluation and management by telemedicine and the availability of in person appointments. In light of current covid-19 pandemic, patient also understands that we are trying to protect them by minimizing in office contact if at all possible.  The patient expressed consent for telemedicine visit and agreed to proceed. Patient understands insurance will be billed.   ROS- No chest pain or shortness of breath. No headache or blurry vision.  Some anxiety but reasonably controlled   Past Medical History-  Patient Active Problem List   Diagnosis Date Noted  . Former smoker 03/12/2016    Priority: High  . Weak urinary stream 11/17/2017    Priority: Medium  . GAD (generalized anxiety disorder) 05/26/2007    Priority: Medium  . Hyperlipidemia  04/16/2007    Priority: Medium  . Essential hypertension 04/16/2007    Priority: Medium  . Obesity (BMI 30.0-34.9) 04/16/2007    Priority: Low  . DEVIATED SEPTUM 04/16/2007    Priority: Low  . GERD 04/16/2007    Priority: Low  . Low back pain 07/26/2011    Medications- reviewed and updated Current Outpatient Medications  Medication Sig Dispense Refill  . amLODipine (NORVASC) 5 MG tablet TAKE 1 TABLET BY MOUTH DAILY. 91 tablet 3  . buPROPion (WELLBUTRIN XL) 150 MG 24 hr tablet Take 150 mg by mouth daily.     . clonazePAM (KLONOPIN) 0.5 MG tablet Take 0.5 mg by mouth as needed.     [name] olmesartan (BENICAR) 40 MG tablet Take 1 tablet (40 mg total) by mouth daily. 90 tablet 3  . simvastatin (ZOCOR) 20 MG tablet Take 1 tablet (20 mg total) by mouth at bedtime. 91 tablet 3  . venlafaxine XR (EFFEXOR-XR) 75 MG 24 hr capsule Take 150 mg by mouth daily.  3  . zolpidem (AMBIEN CR) 12.5 MG CR tablet Take 12.5 mg by mouth at bedtime.  3   No current facility-administered medications for this visit.      Objective:  BP 130/80   Ht 6' (1.829 m)   Wt 240 lb (108.9 kg)   BMI 32.55 kg/m  Gen: NAD, resting comfortably Lungs: nonlabored, normal respiratory rate  Skin: appears dry, no obvious rash     Assessment and Plan   #hypertension S: controlled on olmesartan 53m (changed from losartan 100 mg) and amlodipine 575mOn  61m was getting more low 140s.  BP Readings from Last 3 Encounters:  09/30/18 130/80  02/20/18 132/88  11/17/17 128/86  A/P:  Stable. Continue current medications.    #hyperlipidemia S: Reasonably controlled on simvastatin 20 mg Lab Results  Component Value Date   CHOL 136 11/17/2017   HDL 34.20 (L) 11/17/2017   LDLCALC 79 11/17/2017   LDLDIRECT 113.3 02/21/2014   TRIG 115.0 11/17/2017   CHOLHDL 4 11/17/2017   A/P:  Stable. Continue current medications.  Hopeful to update  # anxiety/GAD S: He remains on venlafaxine and Wellbutrin as well as Ambien through  psychiatry.  feels anxiety has been much better lately. Separation not going well- tried for 5-6 months but wife just not interested in reconciling-he is finally coming to accept this new reality.  Mild PHQ 9 elevation at 7 today but a lot of this is related to current COVID-19 pandemic  For insomnia-Takes ambien 4x a week.  Also sparingly uses Klonopin for anxiety  A/P: Stable problem-continue psychiatry follow-up.  Obesity (BMI 30.0-34.9) S:up 5-6 lbs through winter and being more cooped up with covid 19. Thinks he needs to cut down on the portion size. Doing tennis 4-5 times a week. Goal weight 190.  A/P: Patient made tremendous strides previously- I suggested by follow-up in September that he target 10 to 15 pounds off-I believe he can achieve this goal.  Doing a great job with exercise he just needs to improve on the diet-we discussed/counseled him on this.  We opted to defer labs until next physical in September Future Appointments  Date Time Provider [place] 02/26/2019  2:40 PM HMarin Olp MD [facility]   Lab/Order associations: Essential hypertension  Hyperlipidemia, unspecified hyperlipidemia type  Obesity (BMI 30.0-34.9)  GAD (generalized anxiety disorder)  BMI 32.0-32.9,adult  Return precautions advised.  SGarret Reddish MD

## 2018-10-05 NOTE — Telephone Encounter (Signed)
We addressed this at the patient's last office/video visit

## 2018-10-13 ENCOUNTER — Other Ambulatory Visit: Payer: Self-pay | Admitting: Urology

## 2018-10-13 ENCOUNTER — Ambulatory Visit (HOSPITAL_COMMUNITY)
Admission: AD | Admit: 2018-10-13 | Discharge: 2018-10-13 | Disposition: A | Discharge status: Home / Self Care | Payer: Commercial Managed Care - PPO | Source: Other Acute Inpatient Hospital | Attending: Urology | Admitting: Urology

## 2018-10-13 ENCOUNTER — Ambulatory Visit (HOSPITAL_COMMUNITY): Payer: Commercial Managed Care - PPO | Admitting: Anesthesiology

## 2018-10-13 ENCOUNTER — Other Ambulatory Visit: Payer: Self-pay

## 2018-10-13 ENCOUNTER — Encounter (HOSPITAL_COMMUNITY)
Admission: AD | Disposition: A | Discharge status: Home / Self Care | Payer: Self-pay | Source: Other Acute Inpatient Hospital | Attending: Urology

## 2018-10-13 ENCOUNTER — Encounter (HOSPITAL_COMMUNITY): Payer: Self-pay | Admitting: *Deleted

## 2018-10-13 DIAGNOSIS — N35919 Unspecified urethral stricture, male, unspecified site: Secondary | ICD-10-CM

## 2018-10-13 DIAGNOSIS — Z8249 Family history of ischemic heart disease and other diseases of the circulatory system: Secondary | ICD-10-CM | POA: Insufficient documentation

## 2018-10-13 DIAGNOSIS — Z881 Allergy status to other antibiotic agents status: Secondary | ICD-10-CM | POA: Diagnosis not present

## 2018-10-13 DIAGNOSIS — I10 Essential (primary) hypertension: Secondary | ICD-10-CM | POA: Diagnosis not present

## 2018-10-13 DIAGNOSIS — R310 Gross hematuria: Secondary | ICD-10-CM | POA: Insufficient documentation

## 2018-10-13 DIAGNOSIS — Z841 Family history of disorders of kidney and ureter: Secondary | ICD-10-CM | POA: Insufficient documentation

## 2018-10-13 DIAGNOSIS — Z79899 Other long term (current) drug therapy: Secondary | ICD-10-CM | POA: Diagnosis not present

## 2018-10-13 DIAGNOSIS — Z87891 Personal history of nicotine dependence: Secondary | ICD-10-CM | POA: Diagnosis not present

## 2018-10-13 DIAGNOSIS — E7800 Pure hypercholesterolemia, unspecified: Secondary | ICD-10-CM | POA: Insufficient documentation

## 2018-10-13 DIAGNOSIS — R339 Retention of urine, unspecified: Secondary | ICD-10-CM | POA: Insufficient documentation

## 2018-10-13 DIAGNOSIS — N35912 Unspecified bulbous urethral stricture, male: Secondary | ICD-10-CM | POA: Insufficient documentation

## 2018-10-13 DIAGNOSIS — Z8349 Family history of other endocrine, nutritional and metabolic diseases: Secondary | ICD-10-CM | POA: Insufficient documentation

## 2018-10-13 DIAGNOSIS — K219 Gastro-esophageal reflux disease without esophagitis: Secondary | ICD-10-CM | POA: Insufficient documentation

## 2018-10-13 DIAGNOSIS — F419 Anxiety disorder, unspecified: Secondary | ICD-10-CM | POA: Insufficient documentation

## 2018-10-13 HISTORY — PX: CYSTOSCOPY W/ URETERAL STENT PLACEMENT: SHX1429

## 2018-10-13 HISTORY — DX: Gastro-esophageal reflux disease without esophagitis: K21.9

## 2018-10-13 HISTORY — PX: BALLOON DILATION: SHX5330

## 2018-10-13 HISTORY — DX: Essential (primary) hypertension: I10

## 2018-10-13 HISTORY — DX: Hyperlipidemia, unspecified: E78.5

## 2018-10-13 SURGERY — CYSTOSCOPY, WITH RETROGRADE PYELOGRAM AND URETERAL STENT INSERTION
Anesthesia: General | Site: Urethra

## 2018-10-13 MED ORDER — IOHEXOL 300 MG/ML  SOLN
INTRAMUSCULAR | Status: DC | PRN
  Administered 2018-10-13: 60 mL via INTRAVENOUS

## 2018-10-13 MED ORDER — CIPROFLOXACIN HCL 500 MG PO TABS: 500.0000 mg | ORAL_TABLET | Freq: Two times a day (BID) | ORAL | 0 refills | Status: DC

## 2018-10-13 MED ORDER — HYDROMORPHONE HCL 1 MG/ML IJ SOLN: 0.2500 mg | INTRAMUSCULAR | Status: DC | PRN

## 2018-10-13 MED ORDER — DEXAMETHASONE SODIUM PHOSPHATE 10 MG/ML IJ SOLN
INTRAMUSCULAR | Status: AC
  Filled 2018-10-13: qty 1

## 2018-10-13 MED ORDER — TRAMADOL HCL 50 MG PO TABS: 50.0000 mg | ORAL_TABLET | Freq: Four times a day (QID) | ORAL | 0 refills | Status: DC | PRN

## 2018-10-13 MED ORDER — PROPOFOL 10 MG/ML IV BOLUS
INTRAVENOUS | Status: DC | PRN
  Administered 2018-10-13: 50 mg via INTRAVENOUS
  Administered 2018-10-13: 200 mg via INTRAVENOUS
  Administered 2018-10-13: 50 mg via INTRAVENOUS

## 2018-10-13 MED ORDER — BELLADONNA ALKALOIDS-OPIUM 16.2-60 MG RE SUPP
RECTAL | Status: DC | PRN
  Administered 2018-10-13: 1 via RECTAL

## 2018-10-13 MED ORDER — LACTATED RINGERS IV SOLN
INTRAVENOUS | Status: DC
  Administered 2018-10-13 (×2): via INTRAVENOUS

## 2018-10-13 MED ORDER — CEFAZOLIN SODIUM-DEXTROSE 2-4 GM/100ML-% IV SOLN
INTRAVENOUS | Status: AC
  Filled 2018-10-13: qty 100

## 2018-10-13 MED ORDER — MIDAZOLAM HCL 2 MG/2ML IJ SOLN
INTRAMUSCULAR | Status: AC
  Filled 2018-10-13: qty 2

## 2018-10-13 MED ORDER — FENTANYL CITRATE (PF) 100 MCG/2ML IJ SOLN
INTRAMUSCULAR | Status: AC
  Filled 2018-10-13: qty 2

## 2018-10-13 MED ORDER — DIATRIZOATE MEGLUMINE 30 % UR SOLN
URETHRAL | Status: AC
  Filled 2018-10-13: qty 300

## 2018-10-13 MED ORDER — FENTANYL CITRATE (PF) 100 MCG/2ML IJ SOLN
INTRAMUSCULAR | Status: DC | PRN
  Administered 2018-10-13: 100 ug via INTRAVENOUS
  Administered 2018-10-13: 50 ug via INTRAVENOUS

## 2018-10-13 MED ORDER — CIPROFLOXACIN HCL 500 MG PO TABS: 500.0000 mg | ORAL_TABLET | Freq: Two times a day (BID) | ORAL | 0 refills | Status: AC

## 2018-10-13 MED ORDER — ONDANSETRON HCL 4 MG/2ML IJ SOLN
INTRAMUSCULAR | Status: AC
  Filled 2018-10-13: qty 2

## 2018-10-13 MED ORDER — MIDAZOLAM HCL 5 MG/5ML IJ SOLN
INTRAMUSCULAR | Status: DC | PRN
  Administered 2018-10-13: 2 mg via INTRAVENOUS

## 2018-10-13 MED ORDER — SUCCINYLCHOLINE CHLORIDE 20 MG/ML IJ SOLN
INTRAMUSCULAR | Status: DC | PRN
  Administered 2018-10-13: 140 mg via INTRAVENOUS

## 2018-10-13 MED ORDER — ONDANSETRON HCL 4 MG/2ML IJ SOLN
INTRAMUSCULAR | Status: DC | PRN
  Administered 2018-10-13: 4 mg via INTRAVENOUS

## 2018-10-13 MED ORDER — PROMETHAZINE HCL 25 MG/ML IJ SOLN: 6.2500 mg | INTRAMUSCULAR | Status: DC | PRN

## 2018-10-13 MED ORDER — DEXAMETHASONE SODIUM PHOSPHATE 10 MG/ML IJ SOLN
INTRAMUSCULAR | Status: DC | PRN
  Administered 2018-10-13: 8 mg via INTRAVENOUS

## 2018-10-13 MED ORDER — CEFAZOLIN SODIUM-DEXTROSE 2-4 GM/100ML-% IV SOLN
2.0000 g | INTRAVENOUS | Status: AC
  Administered 2018-10-13: 2 g via INTRAVENOUS

## 2018-10-13 MED ORDER — PROPOFOL 10 MG/ML IV BOLUS
INTRAVENOUS | Status: AC
  Filled 2018-10-13: qty 20

## 2018-10-13 MED ORDER — SODIUM CHLORIDE 0.9 % IR SOLN
Status: DC | PRN
  Administered 2018-10-13: 6000 mL

## 2018-10-13 MED ORDER — SUCCINYLCHOLINE CHLORIDE 200 MG/10ML IV SOSY
PREFILLED_SYRINGE | INTRAVENOUS | Status: AC
  Filled 2018-10-13: qty 10

## 2018-10-13 MED ORDER — INDIGOTINDISULFONATE SODIUM 8 MG/ML IJ SOLN
INTRAMUSCULAR | Status: AC
  Filled 2018-10-13: qty 5

## 2018-10-13 MED ORDER — LIDOCAINE HCL URETHRAL/MUCOSAL 2 % EX GEL
CUTANEOUS | Status: AC
  Filled 2018-10-13: qty 5

## 2018-10-13 MED ORDER — EPHEDRINE SULFATE 50 MG/ML IJ SOLN
INTRAMUSCULAR | Status: DC | PRN
  Administered 2018-10-13 (×2): 10 mg via INTRAVENOUS
  Administered 2018-10-13: 5 mg via INTRAVENOUS
  Administered 2018-10-13: 10 mg via INTRAVENOUS
  Administered 2018-10-13: 5 mg via INTRAVENOUS
  Administered 2018-10-13: 10 mg via INTRAVENOUS

## 2018-10-13 MED ORDER — MIDAZOLAM HCL 2 MG/2ML IJ SOLN: 0.5000 mg | Freq: Once | INTRAMUSCULAR | Status: DC | PRN

## 2018-10-13 MED ORDER — EPHEDRINE 5 MG/ML INJ
INTRAVENOUS | Status: AC
  Filled 2018-10-13: qty 10

## 2018-10-13 MED ORDER — MEPERIDINE HCL 50 MG/ML IJ SOLN: 6.2500 mg | INTRAMUSCULAR | Status: DC | PRN

## 2018-10-13 MED ORDER — BELLADONNA ALKALOIDS-OPIUM 16.2-30 MG RE SUPP
RECTAL | Status: AC
  Filled 2018-10-13: qty 1

## 2018-10-13 SURGICAL SUPPLY — 30 items
BAG URINE DRAINAGE (UROLOGICAL SUPPLIES) ×3 IMPLANT
BAG URO CATCHER STRL LF (MISCELLANEOUS) ×3 IMPLANT
BALLN NEPHROSTOMY (BALLOONS) ×3
BALLOON NEPHROSTOMY (BALLOONS) ×2 IMPLANT
BASKET ZERO TIP NITINOL 2.4FR (BASKET) IMPLANT
CATH FOLEY 2W COUNCIL 5CC 18FR (CATHETERS) ×3 IMPLANT
CATH FOLEY 2WAY SLVR  5CC 18FR (CATHETERS) ×1
CATH FOLEY 2WAY SLVR 5CC 18FR (CATHETERS) ×2 IMPLANT
CATH URET 5FR 28IN CONE TIP (BALLOONS)
CATH URET 5FR 28IN OPEN ENDED (CATHETERS) ×3 IMPLANT
CATH URET 5FR 70CM CONE TIP (BALLOONS) IMPLANT
CATH URET WHISTLE 5FR 28IN (CATHETERS) IMPLANT
CLOTH BEACON ORANGE TIMEOUT ST (SAFETY) IMPLANT
COVER WAND RF STERILE (DRAPES) IMPLANT
EXTRACTOR STONE 1.7FRX115CM (UROLOGICAL SUPPLIES) IMPLANT
GLOVE BIOGEL M 8.0 STRL (GLOVE) IMPLANT
GLOVE BIOGEL M STRL SZ7.5 (GLOVE) ×3 IMPLANT
GOWN STRL REUS W/ TWL XL LVL3 (GOWN DISPOSABLE) ×2 IMPLANT
GOWN STRL REUS W/TWL XL LVL3 (GOWN DISPOSABLE) ×4 IMPLANT
GUIDEWIRE ANG ZIPWIRE 038X150 (WIRE) ×3 IMPLANT
GUIDEWIRE STR DUAL SENSOR (WIRE) ×6 IMPLANT
KIT TURNOVER KIT A (KITS) IMPLANT
MANIFOLD NEPTUNE II (INSTRUMENTS) ×3 IMPLANT
PACK CYSTO (CUSTOM PROCEDURE TRAY) ×3 IMPLANT
SHEATH URETERAL 12FRX28CM (UROLOGICAL SUPPLIES) IMPLANT
SHEATH URETERAL 12FRX35CM (MISCELLANEOUS) IMPLANT
SYR 10ML LL (SYRINGE) ×3 IMPLANT
TUBING CONNECTING 10 (TUBING) ×3 IMPLANT
TUBING UROLOGY SET (TUBING) ×3 IMPLANT
WIRE COONS/BENSON .038X145CM (WIRE) IMPLANT

## 2018-10-13 NOTE — Anesthesia Procedure Notes (Signed)

## 2018-10-13 NOTE — Discharge Instructions (Signed)
Foley Catheter Care A soft, flexible tube (Foley catheter) may have been placed in your bladder to drain urine and fluid. Follow these instructions: Taking Care of the Catheter  Keep the area where the catheter leaves your body clean.   Attach the catheter to the leg so there is no tension on the catheter.   Keep the drainage bag below the level of the bladder, but keep it OFF the floor.   Do not take long soaking baths. Your caregiver will give instructions about showering.   Wash your hands before touching ANYTHING related to the catheter or bag.   Using mild soap and warm water on a washcloth:   Clean the area closest to the catheter insertion site using a circular motion around the catheter.   Clean the catheter itself by wiping AWAY from the insertion site for several inches down the tube.   NEVER wipe upward as this could sweep bacteria up into the urethra (tube in your body that normally drains the bladder) and cause infection.   Place a small amount of sterile lubricant at the tip of the penis where the catheter is entering.  Taking Care of the Drainage Bags  Two drainage bags may be taken home: a large overnight drainage bag, and a smaller leg bag which fits underneath clothing.   It is okay to wear the overnight bag at any time, but NEVER wear the smaller leg bag at night.   Keep the drainage bag well below the level of your bladder. This prevents backflow of urine into the bladder and allows the urine to drain freely.   Anchor the tubing to your leg to prevent pulling or tension on the catheter. Use tape or a leg strap provided by the hospital.   Empty the drainage bag when it is 1/2 to 3/4 full. Wash your hands before and after touching the bag.   Periodically check the tubing for kinks to make sure there is no pressure on the tubing which could restrict the flow of urine.  Changing the Drainage Bags  Cleanse both ends of the clean bag with alcohol before changing.     Pinch off the rubber catheter to avoid urine spillage during the disconnection.   Disconnect the dirty bag and connect the clean one.   Empty the dirty bag carefully to avoid a urine spill.   Attach the new bag to the leg with tape or a leg strap.  Cleaning the Drainage Bags  Whenever a drainage bag is disconnected, it must be cleaned quickly so it is ready for the next use.   Wash the bag in warm, soapy water.   Rinse the bag thoroughly with warm water.   Soak the bag for 30 minutes in a solution of white vinegar and water (1 cup vinegar to 1 quart warm water).   Rinse with warm water.  SEEK MEDICAL CARE IF:   You have chills or night sweats.   You are leaking around your catheter or have problems with your catheter. It is not uncommon to have sporadic leakage around your catheter as a result of bladder spasms. If the leakage stops, there is not much need for concern. If you are uncertain, call your caregiver.   You develop side effects that you think are coming from your medicines.  SEEK IMMEDIATE MEDICAL CARE IF:   You are suddenly unable to urinate. Check to see if there are any kinks in the drainage tubing that may cause this. If  you cannot find any kinks, call your caregiver immediately. This is an emergency.   You develop shortness of breath or chest pains.   Bleeding persists or clots develop in your urine.   You have a fever.   You develop pain in your back or over your lower belly (abdomen).   You develop pain or swelling in your legs.   Any problems you are having get worse rather than better.  MAKE SURE YOU:   Understand these instructions.   Will watch your condition.   Will get help right away if you are not doing well or get worse.

## 2018-10-13 NOTE — Interval H&P Note (Signed)
History and Physical Interval Note:  10/13/2018 3:55 PM  Henry Houston  has presented today for surgery, with the diagnosis of urinary retention.  The various methods of treatment have been discussed with the patient and family. After consideration of risks, benefits and other options for treatment, the patient has consented to  Procedure(s): CYSTOSCOPY WITH RETROGRADE PYELOGRAM/URETERAL STENT PLACEMENT (Bilateral) BALLOON DILATION (N/A) as a surgical intervention.  The patient's history has been reviewed, patient examined, no change in status, stable for surgery.  I have reviewed the patient's chart and labs.  Questions were answered to the patient's satisfaction.     [name]

## 2018-10-13 NOTE — H&P (Signed)
gross hematuria evaluation  HPI: Henry Houston is a 48 year-old male established patient who is here for further evaluation of gross hematuria.[name]  He first noticed the blood 10/12/2018.   The patient has not had any recent abdominal imaging. He has not been told that he has blood in his urine prior to this episode.   The patient has had progression of his voiding symptoms over the last 6 months including dysuria. The patient complains of recent onset suprapubic pain.   The patient does not have a history of recurrent UTIs. They do not have a history of kidney stones. He has not been exposed to occupational hazards that may increase their risks for developing cancer. The patient has no family history of GU malignancy. The patient is a former smoker.   Patient seen initially for pelvic floor dysfunction. He was treated with PT successfully.  Their last note stateson 09/23/18: "Patient's pain is overall improved. In fact, he reports that he does not consider it a feeling of pain, rather, he can note when his pelvic floor tends to tighten up slight. It has not worsened in the past month since his last session. He has no pain/tightness at present. He reports that his urinary stream is not significantly weak and not bothersome to him and he has not had any episodes of nocturnal enuresis. He feels able to manage his symptoms on his own at this time and agrees for discharge. "     ALLERGIES: Ceftin    MEDICATIONS: Simvastatin 20 mg tablet  Amlodipine Besylate 5 mg tablet  Bupropion Xl 450 mg tablet, extended release 24 hr  Clonazepam 0.25 mg tablet,disintegrating  Olmesartan  Prilosec Otc 20 mg tablet, delayed release     GU PSH: None   NON-GU PSH: Deviated Septum Surgery Hand/finger Surgery    GU PMH: Nocturnal Enuresis - 07/27/2018 Pelvic/perineal pain - 07/27/2018 Urinary Frequency - 07/27/2018 BPH w/LUTS - 11/24/2017 Weak Urinary Stream - 11/24/2017    NON-GU PMH: Muscle weakness  (generalized) - 07/27/2018 Other muscle spasm - 07/27/2018 Anxiety GERD Hypercholesterolemia Hypertension    FAMILY HISTORY: Heart Disease - Grandfather High Blood Pressure - Father High Cholesterol - Brother, Father, Mother Kidney Stones - Grandfather Thyroid Cancer - Father   SOCIAL HISTORY: Marital Status: Married Preferred Language: English; Ethnicity: Not Hispanic Or Latino; Race: White Current Smoking Status: Patient smokes. Smokes less than 1/2 pack per day.   Tobacco Use Assessment Completed: Used Tobacco in last 30 days? Does drink.  Drinks 2 caffeinated drinks per day.    REVIEW OF SYSTEMS:    GU Review Male:   Patient reports burning/ pain with urination. Patient denies frequent urination, hard to postpone urination, get up at night to urinate, leakage of urine, stream starts and stops, trouble starting your stream, have to strain to urinate , erection problems, and penile pain.  Gastrointestinal (Upper):   Patient denies nausea, vomiting, and indigestion/ heartburn.  Gastrointestinal (Lower):   Patient denies diarrhea and constipation.  Constitutional:   Patient denies fever, night sweats, weight loss, and fatigue.  Skin:   Patient denies skin rash/ lesion and itching.  Eyes:   Patient denies blurred vision and double vision.  Ears/ Nose/ Throat:   Patient denies sore throat and sinus problems.  Hematologic/Lymphatic:   Patient denies swollen glands and easy bruising.  Cardiovascular:   Patient denies leg swelling and chest pains.  Respiratory:   Patient denies cough and shortness of breath.  Endocrine:   Patient denies excessive  thirst.  Musculoskeletal:   Patient denies back pain and joint pain.  Neurological:   Patient denies headaches and dizziness.  Psychologic:   Patient denies depression and anxiety.   Notes: dysuria    VITAL SIGNS:      10/13/2018 09:18 AM  Weight 245 lb / 111.13 kg  BP 157/94 mmHg  Pulse 85 /min  Temperature 97.9 F / 36.6 C   GU  PHYSICAL EXAMINATION:    Scrotum: No lesions. No edema. No cysts. No warts.  Epididymides: Right: no spermatocele, no masses, no cysts, no tenderness, no induration, no enlargement. Left: no spermatocele, no masses, no cysts, no tenderness, no induration, no enlargement.  Testes: No tenderness, no swelling, no enlargement left testes. No tenderness, no swelling, no enlargement right testes. Normal location left testes. Normal location right testes. No mass, no cyst, no varicocele, no hydrocele left testes. No mass, no cyst, no varicocele, no hydrocele right testes.  Urethral Meatus: Normal size. No lesion, no wart, no discharge, no polyp. Normal location.  Penis: Circumcised, no warts, no cracks. No dorsal Peyronie's plaques, no left corporal Peyronie's plaques, no right corporal Peyronie's plaques, no scarring, no warts. No balanitis, no meatal stenosis.   MULTI-SYSTEM PHYSICAL EXAMINATION:    Constitutional: Well-nourished. No physical deformities. Normally developed. Good grooming.  Respiratory: Normal breath sounds. No labored breathing, no use of accessory muscles.   Cardiovascular: Regular rate and rhythm. No murmur, no gallop. Normal temperature, normal extremity pulses, no swelling, no varicosities.      PAST DATA REVIEWED:  Source Of History:  Patient  Records Review:   Previous Doctor Records, Previous Patient Records, POC Tool   PROCEDURES:         Flexible Cystoscopy - 52000  Risks, benefits, and some of the potential complications of the procedure were discussed at length with the patient including infection, bleeding, voiding discomfort, urinary retention, fever, chills, sepsis, and others. All questions were answered. Informed consent was obtained. Sterile technique and intraurethral analgesia were used.  Meatus:  Normal size. Normal location. Normal condition.  Urethra:  Moderate penile stricture.  External Sphincter:  Normal.  Verumontanum:  Normal.      The lower urinary  tract was carefully examined. The procedure was well-tolerated and without complications. Antibiotic instructions were given. Instructions were given to call the office immediately for bloody urine, difficulty urinating, urinary retention, painful or frequent urination, fever, chills, nausea, vomiting or other illness. The patient stated that he understood these instructions and would comply with them.         Urinalysis w/Scope Dipstick Dipstick Cont'd Micro  Color: Amber Bilirubin: Invalid mg/dL WBC/hpf: 0 - 5/hpf  Appearance: Clear Ketones: Invalid mg/dL RBC/hpf: 0 - 2/hpf  Specific Gravity: Invalid Blood: Invalid ery/uL Bacteria: NS (Not Seen)  pH: Invalid Protein: Invalid mg/dL Cystals: NS (Not Seen)  Glucose: Invalid mg/dL Urobilinogen: Invalid mg/dL Casts: NS (Not Seen)    Nitrites: Invalid Trichomonas: Not Present    Leukocyte Esterase: Invalid leu/uL Mucous: Not Present      Epithelial Cells: NS (Not Seen)      Yeast: NS (Not Seen)      Sperm: Not Present    Notes: no dip due to discoloration/ unspun urine microscopic performed    ASSESSMENT:      ICD-10 Details  1 GU:   Gross hematuria - R31.0   2   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 Thus, I was unable to evaluate his prostate or bladder for etiology of his hematuria. Am concerned that he may develop urinary retention given the trauma to his urethra incurred during today's office cysto urethroscopy. As such, I recommended that we urgently proceed to the OR for dilation of the urethra and further evaluation of his gross hematuria. I plan to do retrograde pyelograms bilaterally to evaluate the upper urinary tract. He should also obtain an ultrasound his follow-up visit. We will plan to perform a voiding trial  at the end of the week. He understands the need a catheter following today's procedure.

## 2018-10-13 NOTE — Transfer of Care (Signed)
Immediate Anesthesia Transfer of Care Note  Patient: Henry Houston  Procedure(s) Performed: CYSTOSCOPY WITH RETROGRADE PYELOGRAM/URETERAL STENT PLACEMENT (Bilateral Urethra) BALLOON DILATION (N/A Ureter)  Patient Location: PACU  Anesthesia Type:General  Level of Consciousness: awake, alert  and oriented  Airway & Oxygen Therapy: Patient Spontanous Breathing and Patient connected to face mask oxygen  Post-op Assessment: Report given to RN and Post -op Vital signs reviewed and stable  Post vital signs: Reviewed and stable  Last Vitals:  Vitals Value Taken Time  BP    Temp    Pulse    Resp    SpO2      Last Pain:  Vitals:   10/13/18 1318  TempSrc: Oral         Complications: No apparent anesthesia complications

## 2018-10-13 NOTE — Anesthesia Preprocedure Evaluation (Addendum)
Anesthesia Evaluation  Patient identified by MRN, date of birth, ID band Patient awake    Reviewed: Allergy & Precautions, NPO status , Patient's Chart, lab work & pertinent test results  History of Anesthesia Complications Negative for: history of anesthetic complications  Airway Mallampati: II  TM Distance: >3 FB Neck ROM: Full    Dental  (+) Dental Advisory Given   Pulmonary neg pulmonary ROS, former smoker,    breath sounds clear to auscultation       Cardiovascular hypertension, Pt. on medications (-) angina Rhythm:Regular Rate:Normal     Neuro/Psych negative neurological ROS     GI/Hepatic Neg liver ROS, GERD  Medicated and Controlled,  Endo/Other  negative endocrine ROS  Renal/GU negative Renal ROS   Urinary retention    Musculoskeletal   Abdominal   Peds  Hematology negative hematology ROS (+)   Anesthesia Other Findings   Reproductive/Obstetrics                            Anesthesia Physical Anesthesia Plan  ASA: II  Anesthesia Plan: General   Post-op Pain Management:    Induction: Intravenous  PONV Risk Score and Plan: 2 and Ondansetron and Dexamethasone  Airway Management Planned: Oral ETT  Additional Equipment:   Intra-op Plan:   Post-operative Plan: Extubation in OR  Informed Consent: I have reviewed the patients History and Physical, chart, labs and discussed the procedure including the risks, benefits and alternatives for the proposed anesthesia with the patient or authorized representative who has indicated his/her understanding and acceptance.     Dental advisory given  Plan Discussed with: CRNA and Surgeon  Anesthesia Plan Comments:        Anesthesia Quick Evaluation

## 2018-10-13 NOTE — Anesthesia Postprocedure Evaluation (Signed)
Anesthesia Post Note  Patient: Henry Houston  Procedure(s) Performed: CYSTOSCOPY WITH RETROGRADE PYELOGRAM/URETERAL STENT PLACEMENT (Bilateral Urethra) BALLOON DILATION (N/A Ureter)     Patient location during evaluation: PACU Anesthesia Type: General Level of consciousness: awake and alert, patient cooperative and oriented Pain management: pain level controlled Vital Signs Assessment: post-procedure vital signs reviewed and stable Respiratory status: spontaneous breathing, nonlabored ventilation and respiratory function stable Cardiovascular status: blood pressure returned to baseline and stable Postop Assessment: no apparent nausea or vomiting Anesthetic complications: no                  Bisson,E. Dane Kopke

## 2018-10-13 NOTE — Op Note (Signed)
Preoperative diagnosis:  1. Gross hematuria 2. Urinary retention 3. Bulbar urethral stricture  Postoperative diagnosis:  1. Same  Procedure: 1. Cystourethroscopy 2. Retrograde urethrogram with interpretation 3. Urethral balloon dilation 4. Bilateral retrograde pyelogram with interpretation  Surgeon: [name], MD  Anesthesia: General  Complications: None  Intraoperative findings:  The patient had a fairly dense 1.5 cm urethral stricture in the bulbar urethra just proximal to the membranous urethra.  The remaining aspect of the patient's urethra and prostatic urethra were normal.  The patient was noted to have 1.75 L of urine in his bladder.  There was some blood clot but no evidence of bleeding actively.  There were no tumors or significant bladder mucosal abnormalities.  The bladder was trabeculated, +1, but otherwise normal.  The ureters were orthotopic.  The retrograde urethrogram demonstrated a normal anterior urethra with a pinpoint stricture in the bulbar urethra measuring approximately 1.5 cm.  The prosthetic urethra was normal-appearing.  Contrast was passing all the way into the patient's bladder.  Retrograde pyelograms were performed bilaterally for his gross hematuria.  10 cc of Omnipaque contrast was instilled into a 5 [place] open-ended catheter into the patient's left ureteral orifice demonstrating a normal caliber ureter with no filling defects or hydroureteronephrosis.  10 cc of contrast was instilled in the patient's right ureter in a similar fashion demonstrating a normal caliber ureter with no filling defects or significant abnormalities.  The calyces were sharp and there is no hydroureteronephrosis.  An 9 [place] council tip catheter was passed over a wire to the patient's urethra into the bladder at the end of the case.  EBL: Minimal  Specimens: None  Indication: Henry Houston is a 48 y.o. patient with significant voiding symptoms and recent onset gross  hematuria.  After reviewing the management options for treatment, he elected to proceed with the above surgical procedure(s). We have discussed the potential benefits and risks of the procedure, side effects of the proposed treatment, the likelihood of the patient achieving the goals of the procedure, and any potential problems that might occur during the procedure or recuperation. Informed consent has been obtained.  Description of procedure:  The patient was taken to the operating room and general anesthesia was induced.  The patient was placed in the dorsal lithotomy position, prepped and draped in the usual sterile fashion, and preoperative antibiotics were administered. A preoperative time-out was performed.   21 French 30 degrees cystoscope was gently passed to the patient's urethra and down to the urethral stricture.  I was able to advance a sensor wire through the true lumen of the urethra and advanced it into the bladder.  However fluoroscopy demonstrated the bladder had advanced quite far into the patient's peritoneum and I was concerned that I was in fact not in the patient's true lumen and that the wire was passing posterior to the bladder.  As such, I opted to pass a semirigid ureteroscope through the patient's urethra and navigate into the patient's bladder under visual guidance.  I was able to follow the wire and indeed confirm the wire was in the patient's bladder.  The patient's bladder was abnormally large.  I then advanced the wire through the rigid ureteroscope and remove the scope over the wire.  I subsequently passed a 24 French x10 cm balloon dilator over the wire and performed a balloon dilation into separate parts of the patient's urethra ensuring that the strictured area was appropriately dilated.  I then was able to navigate the  patient's urethra all the way into the bladder following the wire with the rigid 21 French cystoscope.  I then emptied the patient's bladder into a graduated  cylinder measuring 1.75 L of urine.  I then slowly filled it performing cystoscopy in a 360 degree fashion.  The above findings were then noted.  I then performed retrograde pyelograms bilaterally with the above findings.  Then slowly backed out the scope noting no significant abnormalities beyond the patient's dilated bulbar urethra.  At this point I opted to advance an 22 [place] council tip catheter over the wire.  This was done atraumatically.  10 cc of sterile water was inflated to the patient's balloon.  A B&O was passed into the patient's rectum.  He was subsequently extubated return the PACU in stable condition.   [name], M.D.

## 2018-10-14 ENCOUNTER — Encounter: Payer: Self-pay | Admitting: Family Medicine

## 2018-10-14 ENCOUNTER — Encounter (HOSPITAL_COMMUNITY): Payer: Self-pay | Admitting: Urology

## 2019-02-26 ENCOUNTER — Encounter: Payer: Commercial Managed Care - PPO | Admitting: Family Medicine

## 2019-03-01 NOTE — Patient Instructions (Addendum)
Schedule a lab visit at the check out desk within 2 weeks. Return for future fasting labs meaning nothing but water after midnight please. Ok to take your medications with water.   Team- please give cologuard handout so he can call and check to see if insurance covers  regularly takes omeprazole. Could try pepcid OTC which has better long term safety profile.   Love your idea of getting back on weight loss journey- good job with exercise- lets tighten up the diet like you have before
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
## 2019-03-02 ENCOUNTER — Other Ambulatory Visit: Payer: Self-pay

## 2019-03-02 ENCOUNTER — Ambulatory Visit (INDEPENDENT_AMBULATORY_CARE_PROVIDER_SITE_OTHER): Payer: Commercial Managed Care - PPO | Admitting: Family Medicine

## 2019-03-02 ENCOUNTER — Encounter: Payer: Self-pay | Admitting: Family Medicine

## 2019-03-02 VITALS — BP 130/86 | HR 71 | Temp 98.4°F | Ht 72.0 in | Wt 247.4 lb

## 2019-03-02 DIAGNOSIS — E785 Hyperlipidemia, unspecified: Secondary | ICD-10-CM | POA: Diagnosis not present

## 2019-03-02 DIAGNOSIS — Z114 Encounter for screening for human immunodeficiency virus [HIV]: Secondary | ICD-10-CM

## 2019-03-02 DIAGNOSIS — Z118 Encounter for screening for other infectious and parasitic diseases: Secondary | ICD-10-CM

## 2019-03-02 DIAGNOSIS — Z125 Encounter for screening for malignant neoplasm of prostate: Secondary | ICD-10-CM | POA: Diagnosis not present

## 2019-03-02 DIAGNOSIS — Z0000 Encounter for general adult medical examination without abnormal findings: Secondary | ICD-10-CM

## 2019-03-02 DIAGNOSIS — Z87891 Personal history of nicotine dependence: Secondary | ICD-10-CM | POA: Diagnosis not present

## 2019-03-02 DIAGNOSIS — Z1211 Encounter for screening for malignant neoplasm of colon: Secondary | ICD-10-CM

## 2019-03-02 DIAGNOSIS — Z113 Encounter for screening for infections with a predominantly sexual mode of transmission: Secondary | ICD-10-CM

## 2019-03-05 ENCOUNTER — Other Ambulatory Visit (INDEPENDENT_AMBULATORY_CARE_PROVIDER_SITE_OTHER): Payer: Commercial Managed Care - PPO

## 2019-03-05 ENCOUNTER — Other Ambulatory Visit (HOSPITAL_COMMUNITY)
Admission: RE | Admit: 2019-03-05 | Discharge: 2019-03-05 | Disposition: A | Discharge status: Home / Self Care | Payer: Commercial Managed Care - PPO | Source: Ambulatory Visit | Attending: Family Medicine | Admitting: Family Medicine

## 2019-03-05 ENCOUNTER — Other Ambulatory Visit: Payer: Self-pay

## 2019-03-05 ENCOUNTER — Other Ambulatory Visit: Payer: Self-pay | Admitting: Family Medicine

## 2019-03-05 DIAGNOSIS — Z87891 Personal history of nicotine dependence: Secondary | ICD-10-CM | POA: Diagnosis not present

## 2019-03-05 DIAGNOSIS — Z125 Encounter for screening for malignant neoplasm of prostate: Secondary | ICD-10-CM | POA: Diagnosis not present

## 2019-03-05 DIAGNOSIS — Z118 Encounter for screening for other infectious and parasitic diseases: Secondary | ICD-10-CM | POA: Diagnosis present

## 2019-03-05 DIAGNOSIS — Z113 Encounter for screening for infections with a predominantly sexual mode of transmission: Secondary | ICD-10-CM | POA: Diagnosis present

## 2019-03-05 DIAGNOSIS — Z0000 Encounter for general adult medical examination without abnormal findings: Secondary | ICD-10-CM | POA: Diagnosis not present

## 2019-03-05 DIAGNOSIS — E785 Hyperlipidemia, unspecified: Secondary | ICD-10-CM | POA: Diagnosis not present

## 2019-03-05 DIAGNOSIS — Z114 Encounter for screening for human immunodeficiency virus [HIV]: Secondary | ICD-10-CM

## 2019-03-05 LAB — COMPREHENSIVE METABOLIC PANEL
ALT: 24 U/L (ref 0–53)
AST: 21 U/L (ref 0–37)
Albumin: 4.3 g/dL (ref 3.5–5.2)
Alkaline Phosphatase: 60 U/L (ref 39–117)
BUN: 22 mg/dL (ref 6–23)
CO2: 26 mEq/L (ref 19–32)
Calcium: 9.3 mg/dL (ref 8.4–10.5)
Chloride: 102 mEq/L (ref 96–112)
Creatinine, Ser: 1.05 mg/dL (ref 0.40–1.50)
GFR: 75.91 mL/min (ref >60.00)
Glucose, Bld: 105 mg/dL — ABNORMAL HIGH (ref 70–99)
Potassium: 4.8 mEq/L (ref 3.5–5.1)
Sodium: 136 mEq/L (ref 135–145)
Total Bilirubin: 0.5 mg/dL (ref 0.2–1.2)
Total Protein: 6.7 g/dL (ref 6.0–8.3)

## 2019-03-05 LAB — POC URINALSYSI DIPSTICK (AUTOMATED)
Bilirubin, UA: NEGATIVE
Blood, UA: NEGATIVE
Glucose, UA: NEGATIVE
Ketones, UA: NEGATIVE
Leukocytes, UA: NEGATIVE
Nitrite, UA: NEGATIVE
Protein, UA: NEGATIVE
Spec Grav, UA: 1.025 (ref 1.010–1.025)
Urobilinogen, UA: 0.2 E.U./dL
pH, UA: 6 (ref 5.0–8.0)

## 2019-03-05 LAB — CBC
HCT: 40 % (ref 39.0–52.0)
Hemoglobin: 13.7 g/dL (ref 13.0–17.0)
MCHC: 34.3 g/dL (ref 30.0–36.0)
MCV: 95 fl (ref 78.0–100.0)
Platelets: 265 10*3/uL (ref 150.0–400.0)
RBC: 4.21 Mil/uL — ABNORMAL LOW (ref 4.22–5.81)
RDW: 13.9 % (ref 11.5–15.5)
WBC: 5.2 10*3/uL (ref 4.0–10.5)

## 2019-03-05 LAB — LIPID PANEL
Cholesterol: 180 mg/dL (ref 0–200)
HDL: 61.9 mg/dL (ref >39.00)
LDL Cholesterol: 106 mg/dL — ABNORMAL HIGH (ref 0–99)
NonHDL: 117.91
Total CHOL/HDL Ratio: 3
Triglycerides: 62 mg/dL (ref 0.0–149.0)
VLDL: 12.4 mg/dL (ref 0.0–40.0)

## 2019-03-05 LAB — PSA: PSA: 0.87 ng/mL (ref 0.10–4.00)

## 2019-03-06 LAB — URINE CYTOLOGY ANCILLARY ONLY
Chlamydia: NEGATIVE
Neisseria Gonorrhea: NEGATIVE
Trichomonas: NEGATIVE

## 2019-03-08 LAB — HIV ANTIBODY (ROUTINE TESTING W REFLEX): HIV 1&2 Ab, 4th Generation: NONREACTIVE

## 2019-03-08 LAB — RPR: RPR Ser Ql: NONREACTIVE

## 2019-03-20 ENCOUNTER — Other Ambulatory Visit: Payer: Self-pay | Admitting: Family Medicine

## 2019-07-25 ENCOUNTER — Other Ambulatory Visit: Payer: Self-pay | Admitting: Family Medicine

## 2019-08-26 ENCOUNTER — Other Ambulatory Visit: Payer: Self-pay

## 2019-08-30 ENCOUNTER — Ambulatory Visit: Payer: Commercial Managed Care - PPO | Admitting: Family Medicine

## 2019-08-30 DIAGNOSIS — Z0289 Encounter for other administrative examinations: Secondary | ICD-10-CM

## 2019-08-30 NOTE — Progress Notes (Signed)
No show appointment

## 2019-08-31 NOTE — Patient Instructions (Signed)
There are no preventive care reminders to display for this patient.  Depression screen [facility] 2/9 03/02/2019 03/02/2019 09/30/2018  Decreased Interest 0 0 1  Down, Depressed, Hopeless 1 0 1  PHQ - 2 Score 1 0 2  Altered sleeping 1 - 2  Tired, decreased energy 0 - 0  Change in appetite 0 - 1  Feeling bad or failure about yourself  1 - 1  Trouble concentrating 0 - 0  Moving slowly or fidgety/restless 1 - 0  Suicidal thoughts 0 - 1  PHQ-9 Score 4 - 7  Difficult doing work/chores Not difficult at all - Somewhat difficult    Recommended follow up: No follow-ups on file.

## 2019-09-20 ENCOUNTER — Telehealth: Payer: Self-pay

## 2019-09-20 NOTE — Telephone Encounter (Signed)
Done

## 2019-10-04 ENCOUNTER — Ambulatory Visit (INDEPENDENT_AMBULATORY_CARE_PROVIDER_SITE_OTHER): Payer: Commercial Managed Care - PPO | Admitting: Family Medicine

## 2019-10-04 ENCOUNTER — Other Ambulatory Visit: Payer: Self-pay

## 2019-10-04 ENCOUNTER — Encounter: Payer: Self-pay | Admitting: Family Medicine

## 2019-10-04 VITALS — BP 124/78 | HR 73 | Temp 97.7°F | Ht 72.0 in | Wt 251.6 lb

## 2019-10-04 DIAGNOSIS — E785 Hyperlipidemia, unspecified: Secondary | ICD-10-CM | POA: Diagnosis not present

## 2019-10-04 DIAGNOSIS — E669 Obesity, unspecified: Secondary | ICD-10-CM

## 2019-10-04 DIAGNOSIS — I10 Essential (primary) hypertension: Secondary | ICD-10-CM | POA: Diagnosis not present

## 2019-10-04 DIAGNOSIS — M654 Radial styloid tenosynovitis [de Quervain]: Secondary | ICD-10-CM | POA: Diagnosis not present

## 2019-10-04 DIAGNOSIS — N35919 Unspecified urethral stricture, male, unspecified site: Secondary | ICD-10-CM

## 2019-10-04 NOTE — Patient Instructions (Addendum)
Health Maintenance Due  Topic Date Due  . COVID-19 Vaccine (1) will send message with dates later  Never done   Check with insurance and make sure they cover cologuard and if so go ahead and submit this  Harriet Pho tenosynovitis-see handout -Start thumb spica brace and wear as much as possible -Continue icing the wrist -Use Voltaren gel 4 times a day to try to decrease inflammation over the next week -Ideally would not play tennis until this is healed up but I know how important the weekend is for you -If you have not made substantial improvement within 2 to 3 weeks please let them place a referral to our sports medicine team so they can look at this and see if ultrasound guided injection is needed -if you do take ibuprofen/aleve keep an eye on blood pressure   Recommended follow up: Return in about 5 months (around 03/05/2020) for physical or sooner if needed.

## 2019-10-04 NOTE — Progress Notes (Signed)
Phone [phone] In person visit   Subjective:   Henry Houston is a 49 y.o. year old very pleasant male patient who presents for/with See problem oriented charting Chief Complaint  Patient presents with  . Hypertension    not fasting    This visit occurred during the SARS-CoV-2 public health emergency.  Safety protocols were in place, including screening questions prior to the visit, additional usage of staff PPE, and extensive cleaning of exam room while observing appropriate contact time as indicated for disinfecting solutions.   Past Medical History-  Patient Active Problem List   Diagnosis Date Noted  . Urethral stricture in male 10/13/2018    Priority: High  . Ex-smoker for less than 1 year 03/12/2016    Priority: High  . GAD (generalized anxiety disorder) 05/26/2007    Priority: Medium  . Hyperlipidemia 04/16/2007    Priority: Medium  . Essential hypertension 04/16/2007    Priority: Medium  . Obesity (BMI 30.0-34.9) 04/16/2007    Priority: Low  . DEVIATED SEPTUM 04/16/2007    Priority: Low  . GERD 04/16/2007    Priority: Low  . Low back pain 07/26/2011    Medications- reviewed and updated Current Outpatient Medications  Medication Sig Dispense Refill  . amLODipine (NORVASC) 5 MG tablet TAKE 1 TABLET BY MOUTH EVERY DAY 30 tablet 11  . buPROPion (WELLBUTRIN XL) 150 MG 24 hr tablet Take 450 mg by mouth daily. Take three tablets daily.    [name] olmesartan (BENICAR) 40 MG tablet TAKE 1 TABLET BY MOUTH EVERY DAY 30 tablet 11  . omeprazole (PRILOSEC) 20 MG capsule Take 20 mg by mouth daily.    . simvastatin (ZOCOR) 20 MG tablet TAKE 1 TABLET BY MOUTH EVERYDAY AT BEDTIME 30 tablet 11  . venlafaxine XR (EFFEXOR-XR) 75 MG 24 hr capsule Take 75 mg by mouth daily.   3  . zolpidem (AMBIEN CR) 12.5 MG CR tablet Take 12.5 mg by mouth at bedtime.  3   No current facility-administered medications for this visit.     Objective:  BP 124/78   Pulse 73   Temp 97.7 F  (36.5 C) (Temporal)   Ht 6' (1.829 m)   Wt 251 lb 9.6 oz (114.1 kg)   SpO2 98%   BMI 34.12 kg/m  Gen: NAD, resting comfortably CV: RRR no murmurs rubs or gallops Lungs: CTAB no crackles, wheeze, rhonchi Ext: no edema Skin: warm, dry Neuro: Good grip strength MSK: Positive Finkelstein's on the right.  Otherwise pretty good range of motion of the wrist without significant pain except with deviation of wrist in ulnar direction     Assessment and Plan   #Health maintenance-did he complete cologuard? NO-strongly encouraged him to do this again today -Has received COVID-19 vaccinations but has to let us know the dates at a later date  # Wrist pain  S: Patient noted right wrist pain on radial side after playing tennis 8 to 9 days ago.  He did not spend a lot of time working on moving over the last week and this seemed to worsen the issue.  He notes a screeching sensation when he deviates wrist to the radial side.  Area is tender to touch and swollen A/P: This appears to be de Quervain's tenosynovitis of the right wrist.  Handout from sports medicine advisor given.  Recommended exercise 3 times a week for a month as long as pain less than 1 or 2 out of 10 From [facility] "  [employer]  tenosynovitis-see handout -Start thumb spica brace and wear as much as possible -Continue icing the wrist -Use Voltaren gel 4 times a day to try to decrease inflammation over the next week -Ideally would not play tennis until this is healed up but I know how important the weekend is for you -If you have not made substantial improvement within 2 to 3 weeks please let them place a referral to our sports medicine team so they can look at this and see if ultrasound guided injection is needed -if you do take ibuprofen/aleve keep an eye on blood pressure  "   #hypertension S: compliant with amlodipine 5 mg, olmesartan 40 mg BP Readings from Last 3 Encounters:  10/04/19 124/78  03/02/19 130/86  10/13/18 128/84   A/P:  Stable. Continue current medications.    #hyperlipidemia/obesity S: compliant with simvastatin 20 mg. We have considered increasing to atorvastatin or rosuvastatin if LDL numbers remain elevated despite lifestyle changes  Diet/exercise- plays a lot of tennis.  Unfortunately weight is up 4 pounds from last visit. Winter was hard for him and gained about 10 lbs- had gotten down to 240.  Lab Results  Component Value Date   CHOL 180 03/05/2019   HDL 61.90 03/05/2019   LDLCALC 106 (H) 03/05/2019   LDLDIRECT 113.3 02/21/2014   TRIG 62.0 03/05/2019   CHOLHDL 3 03/05/2019   A/P: For hyperlipidemia-continue simvastatin-reinforced importance of weight loss-would strongly prefer not to increase Rx For obesity-weight is creeping up.  Feels like the winter months have been challenging.  He knows he needs to begin eating at home more.  He is doing a pretty good job from exercise perspective. Encouraged need for healthy eating, regular exercise, weight loss.    % Generalized anxiety disorder-patient is followed by psychiatry. He is on combination of venlafaxine 75 mg extended release, Wellbutrin 450 mg extended release as well as Klonopin for anxiety and Ambien for sleep -Patient follows with Dr. Caryl Comes may 1 day asked for me to take over this prescription as long as things remain stable  #GERD-patient remains on omeprazole over-the-counter.  Does have hiatal hernia..  Discussed could trial of Pepcid with reasonable control lately-weight loss would also be helpful.  We also discussed the TIF procedure being considered if we have a hard time getting him off medication in the long run  Recommended follow up: Return in about 5 months (around 03/05/2020) for physical or sooner if needed.  Lab/Order associations:   ICD-10-CM   1. De Quervain's tenosynovitis, right  M65.4   2. Essential hypertension  I10   3. Hyperlipidemia, unspecified hyperlipidemia type  E78.5   4. Obesity (BMI 30.0-34.9)   E66.9   5. Stricture of male urethra, unspecified stricture type  N35.919    Return precautions advised.  Garret Reddish, MD

## 2020-03-09 ENCOUNTER — Encounter: Payer: Self-pay | Admitting: Orthopaedic Surgery

## 2020-03-09 ENCOUNTER — Ambulatory Visit: Payer: Self-pay

## 2020-03-09 ENCOUNTER — Other Ambulatory Visit: Payer: Self-pay

## 2020-03-09 ENCOUNTER — Ambulatory Visit (INDEPENDENT_AMBULATORY_CARE_PROVIDER_SITE_OTHER): Payer: Commercial Managed Care - PPO | Admitting: Orthopaedic Surgery

## 2020-03-09 DIAGNOSIS — M79672 Pain in left foot: Secondary | ICD-10-CM | POA: Diagnosis not present

## 2020-03-09 MED ORDER — NITROGLYCERIN 0.2 MG/HR TD PT24: MEDICATED_PATCH | TRANSDERMAL | 12 refills | Status: DC
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
## 2020-03-10 ENCOUNTER — Ambulatory Visit: Payer: Commercial Managed Care - PPO | Admitting: Orthopaedic Surgery

## 2020-03-20 ENCOUNTER — Other Ambulatory Visit: Payer: Self-pay | Admitting: Family Medicine

## 2020-04-27 ENCOUNTER — Other Ambulatory Visit: Payer: Self-pay | Admitting: Orthopaedic Surgery

## 2020-07-15 ENCOUNTER — Other Ambulatory Visit: Payer: Self-pay | Admitting: Family Medicine

## 2021-03-28 ENCOUNTER — Other Ambulatory Visit: Payer: Self-pay | Admitting: Family Medicine

## 2021-07-04 DIAGNOSIS — F331 Major depressive disorder, recurrent, moderate: Secondary | ICD-10-CM | POA: Diagnosis not present

## 2021-07-24 ENCOUNTER — Other Ambulatory Visit: Payer: Self-pay | Admitting: Family Medicine

## 2021-07-31 ENCOUNTER — Other Ambulatory Visit: Payer: Self-pay

## 2021-07-31 ENCOUNTER — Other Ambulatory Visit (HOSPITAL_COMMUNITY)
Admission: RE | Admit: 2021-07-31 | Discharge: 2021-07-31 | Disposition: A | Discharge status: Home / Self Care | Payer: BC Managed Care – PPO | Source: Ambulatory Visit | Attending: Family Medicine | Admitting: Family Medicine

## 2021-07-31 ENCOUNTER — Ambulatory Visit (INDEPENDENT_AMBULATORY_CARE_PROVIDER_SITE_OTHER): Payer: BC Managed Care – PPO | Admitting: Family Medicine

## 2021-07-31 ENCOUNTER — Encounter: Payer: Self-pay | Admitting: Family Medicine

## 2021-07-31 VITALS — BP 118/82 | HR 69 | Temp 98.8°F | Ht 72.0 in | Wt 240.2 lb

## 2021-07-31 DIAGNOSIS — E785 Hyperlipidemia, unspecified: Secondary | ICD-10-CM | POA: Diagnosis not present

## 2021-07-31 DIAGNOSIS — Z1211 Encounter for screening for malignant neoplasm of colon: Secondary | ICD-10-CM

## 2021-07-31 DIAGNOSIS — Z125 Encounter for screening for malignant neoplasm of prostate: Secondary | ICD-10-CM | POA: Diagnosis not present

## 2021-07-31 DIAGNOSIS — Z113 Encounter for screening for infections with a predominantly sexual mode of transmission: Secondary | ICD-10-CM | POA: Diagnosis not present

## 2021-07-31 DIAGNOSIS — Z118 Encounter for screening for other infectious and parasitic diseases: Secondary | ICD-10-CM

## 2021-07-31 DIAGNOSIS — I10 Essential (primary) hypertension: Secondary | ICD-10-CM | POA: Diagnosis not present

## 2021-07-31 DIAGNOSIS — Z79899 Other long term (current) drug therapy: Secondary | ICD-10-CM

## 2021-07-31 DIAGNOSIS — F411 Generalized anxiety disorder: Secondary | ICD-10-CM | POA: Diagnosis not present

## 2021-07-31 DIAGNOSIS — Z114 Encounter for screening for human immunodeficiency virus [HIV]: Secondary | ICD-10-CM | POA: Diagnosis not present

## 2021-07-31 DIAGNOSIS — Z1159 Encounter for screening for other viral diseases: Secondary | ICD-10-CM

## 2021-07-31 LAB — LIPID PANEL
Cholesterol: 197 mg/dL (ref 0–200)
HDL: 59.1 mg/dL (ref >39.00)
LDL Cholesterol: 120 mg/dL — ABNORMAL HIGH (ref 0–99)
NonHDL: 138.35
Total CHOL/HDL Ratio: 3
Triglycerides: 91 mg/dL (ref 0.0–149.0)
VLDL: 18.2 mg/dL (ref 0.0–40.0)

## 2021-07-31 LAB — COMPREHENSIVE METABOLIC PANEL
ALT: 23 U/L (ref 0–53)
AST: 20 U/L (ref 0–37)
Albumin: 4.8 g/dL (ref 3.5–5.2)
Alkaline Phosphatase: 58 U/L (ref 39–117)
BUN: 18 mg/dL (ref 6–23)
CO2: 31 mEq/L (ref 19–32)
Calcium: 9.6 mg/dL (ref 8.4–10.5)
Chloride: 99 mEq/L (ref 96–112)
Creatinine, Ser: 1.02 mg/dL (ref 0.40–1.50)
GFR: 86.74 mL/min (ref >60.00)
Glucose, Bld: 114 mg/dL — ABNORMAL HIGH (ref 70–99)
Potassium: 4.4 mEq/L (ref 3.5–5.1)
Sodium: 135 mEq/L (ref 135–145)
Total Bilirubin: 0.6 mg/dL (ref 0.2–1.2)
Total Protein: 7.7 g/dL (ref 6.0–8.3)

## 2021-07-31 LAB — CBC WITH DIFFERENTIAL/PLATELET
Basophils Absolute: 0.1 10*3/uL (ref 0.0–0.1)
Basophils Relative: 1.3 % (ref 0.0–3.0)
Eosinophils Absolute: 0.1 10*3/uL (ref 0.0–0.7)
Eosinophils Relative: 1.4 % (ref 0.0–5.0)
HCT: 43.1 % (ref 39.0–52.0)
Hemoglobin: 14.6 g/dL (ref 13.0–17.0)
Lymphocytes Relative: 37.4 % (ref 12.0–46.0)
Lymphs Abs: 1.5 10*3/uL (ref 0.7–4.0)
MCHC: 33.8 g/dL (ref 30.0–36.0)
MCV: 91.4 fl (ref 78.0–100.0)
Monocytes Absolute: 0.6 10*3/uL (ref 0.1–1.0)
Monocytes Relative: 14.8 % — ABNORMAL HIGH (ref 3.0–12.0)
Neutro Abs: 1.8 10*3/uL (ref 1.4–7.7)
Neutrophils Relative %: 45.1 % (ref 43.0–77.0)
Platelets: 281 10*3/uL (ref 150.0–400.0)
RBC: 4.72 Mil/uL (ref 4.22–5.81)
RDW: 13.3 % (ref 11.5–15.5)
WBC: 4 10*3/uL (ref 4.0–10.5)

## 2021-07-31 LAB — POC URINALSYSI DIPSTICK (AUTOMATED)
Bilirubin, UA: NEGATIVE
Blood, UA: NEGATIVE
Glucose, UA: NEGATIVE
Ketones, UA: NEGATIVE
Leukocytes, UA: NEGATIVE
Nitrite, UA: NEGATIVE
Protein, UA: NEGATIVE
Spec Grav, UA: >=1.03 — AB (ref 1.010–1.025)
Urobilinogen, UA: 0.2 E.U./dL
pH, UA: 5.5 (ref 5.0–8.0)

## 2021-07-31 LAB — PSA: PSA: 0.81 ng/mL (ref 0.10–4.00)

## 2021-07-31 LAB — VITAMIN B12: Vitamin B-12: 269 pg/mL (ref 211–911)

## 2021-07-31 MED ORDER — ATORVASTATIN CALCIUM 20 MG PO TABS: 20.0000 mg | ORAL_TABLET | Freq: Every day | ORAL | 3 refills | Status: DC

## 2021-07-31 NOTE — Progress Notes (Signed)
Phone [phone] In person visit   Subjective:   Henry Houston is a 51 y.o. year old very pleasant male patient who presents for/with See problem oriented charting Chief Complaint  Patient presents with   Follow-up   Hypertension    This visit occurred during the SARS-CoV-2 public health emergency.  Safety protocols were in place, including screening questions prior to the visit, additional usage of staff PPE, and extensive cleaning of exam room while observing appropriate contact time as indicated for disinfecting solutions.   Past Medical History-  Patient Active Problem List   Diagnosis Date Noted   Urethral stricture in male 10/13/2018    Priority: High   Ex-smoker for less than 1 year 03/12/2016    Priority: High   GAD (generalized anxiety disorder) 05/26/2007    Priority: Medium    Hyperlipidemia 04/16/2007    Priority: Medium    Essential hypertension 04/16/2007    Priority: Medium    Obesity (BMI 30.0-34.9) 04/16/2007    Priority: Low   DEVIATED SEPTUM 04/16/2007    Priority: Low   GERD 04/16/2007    Priority: Low   Low back pain 07/26/2011    Medications- reviewed and updated Current Outpatient Medications  Medication Sig Dispense Refill   amLODipine (NORVASC) 5 MG tablet TAKE 1 TABLET BY MOUTH EVERY DAY 30 tablet 11   buPROPion (WELLBUTRIN XL) 150 MG 24 hr tablet Take 450 mg by mouth daily. Take three tablets daily.     olmesartan (BENICAR) 40 MG tablet TAKE 1 TABLET BY MOUTH EVERY DAY 30 tablet 11   omeprazole (PRILOSEC) 20 MG capsule Take 20 mg by mouth daily.     simvastatin (ZOCOR) 20 MG tablet TAKE 1 TABLET BY MOUTH EVERYDAY AT BEDTIME 30 tablet 11   venlafaxine XR (EFFEXOR-XR) 75 MG 24 hr capsule Take 75 mg by mouth daily.   3   zolpidem (AMBIEN CR) 12.5 MG CR tablet Take 12.5 mg by mouth at bedtime.  3   No current facility-administered medications for this visit.     Objective:  BP 118/82    Pulse 69    Temp 98.8 F (37.1 C)    Ht 6'  (1.829 m)    Wt 240 lb 3.2 oz (109 kg)    SpO2 99%    BMI 32.58 kg/m  Gen: NAD, resting comfortably CV: RRR no murmurs rubs or gallops Lungs: CTAB no crackles, wheeze, rhonchi Ext: no edema Skin: warm, dry    Assessment and Plan   #social update- did a trip to [place] and really enjoyed it.   #hypertension S: compliant with amlodipine 5 mg, olmesartan 40 mg Home readings: 128/80 on most recent home check BP Readings from Last 3 Encounters:  07/31/21 118/82  10/04/19 124/78  03/02/19 130/86  A/P: Controlled. Continue current medications.   #hyperlipidemia/obesity S: compliant with simvastatin 20 mg but some inconsistency. We have considered increasing to atorvastatin or rosuvastatin if LDL numbers remain elevated despite lifestyle changes  Diet/exercise-got down to 220 over the summer- harder in the winter but thankfully compared to 2021 still down 11 lbs!   Lab Results  Component Value Date   CHOL 180 03/05/2019   HDL 61.90 03/05/2019   LDLCALC 106 (H) 03/05/2019   LDLDIRECT 113.3 02/21/2014   TRIG 62.0 03/05/2019   CHOLHDL 3 03/05/2019    A/P: For hyperlipidemia-mildly high in past and had considered increasing dose-he admits to some inconsistency recently-we will update lipids but he states was on lipitor in  past nad wants to retrial- we will send this in.   For obesity-making some improvements nad expects further improvements in the summer months    # GERD S:Medication: Omeprazole 20 mg. Endoscopy in past and was told hiatal hernia A/P: reasonable control- could try pepcid plus weight loss may help  #Urethral stricture-patient is still trying to find time related to work to get surgery done in early 2023-continue self caths once a week to keep things open-working with Dr. Louis Meckel    % Generalized anxiety disorder-patient is followed by psychiatry Dr. Casimiro Needle. He is on combination of venlafaxine 75 mg extended release, Wellbutrin 450 mg extended release Ambien for sleep   - looking to wean off the venlafaxine Depression screen [facility] 2/9 07/31/2021 03/02/2019 03/02/2019  Decreased Interest 0 0 0  Down, Depressed, Hopeless 0 1 0  PHQ - 2 Score 0 1 0  Altered sleeping 0 1 -  Tired, decreased energy 0 0 -  Change in appetite 0 0 -  Feeling bad or failure about yourself  0 1 -  Trouble concentrating 0 0 -  Moving slowly or fidgety/restless 0 1 -  Suicidal thoughts 0 0 -  PHQ-9 Score 0 4 -  Difficult doing work/chores Not difficult at all Not difficult at all -   #Health maintenance-encouraged patient to complete Cologuard and order new order - wants STD screening- ordered today  Recommended follow up: Return in about 6 months (around 01/28/2022) for physical or sooner if needed.  Lab/Order associations: FASTING- unsweet iced tea citric acid in it   ICD-10-CM   1. Essential hypertension  I10 CBC with Differential/Platelet    Comprehensive metabolic panel    POCT Urinalysis Dipstick (Automated)    2. Hyperlipidemia, unspecified hyperlipidemia type  E78.5 Lipid panel    3. GAD (generalized anxiety disorder)  F41.1     4. Screening for colon cancer  Z12.11     5. Encounter for hepatitis C screening test for low risk patient  Z11.59 Hepatitis C antibody    6. Screening for gonorrhea  Z11.3 Urine cytology ancillary only    7. Screening for chlamydial disease  Z11.8 Urine cytology ancillary only    8. Screening for HIV (human immunodeficiency virus)  Z11.4 HIV Antibody (routine testing w rflx)    9. Screening examination for venereal disease  Z11.3 RPR    10. Screen for colon cancer  Z12.11 Cologuard    11. Screening for prostate cancer  Z12.5 PSA    12. High risk medication use  Z79.899 Vitamin B12      Meds ordered this encounter  Medications   atorvastatin (LIPITOR) 20 MG tablet    Sig: Take 1 tablet (20 mg total) by mouth daily.    Dispense:  90 tablet    Refill:  3    Return precautions advised.  Garret Reddish, MD

## 2021-07-31 NOTE — Patient Instructions (Addendum)
Will mychart [place] COVID vaccination dates.  Let us know if you do not cologuard within 3 weeks  Please stop by lab before you go If you have mychart- we will send your results within 3 business days of [place] receiving them.  If you do not have mychart- we will call you about results within 5 business days of [place] receiving them.  *please also note that you will see labs on mychart as soon as they post. I will later go in and write notes on them- will say "notes from Dr. Yong Channel"   Recommended follow up: Return in about 6 months (around 01/28/2022) for physical or sooner if needed.

## 2021-08-01 LAB — HIV ANTIBODY (ROUTINE TESTING W REFLEX): HIV 1&2 Ab, 4th Generation: NONREACTIVE

## 2021-08-01 LAB — URINE CYTOLOGY ANCILLARY ONLY
Chlamydia: NEGATIVE
Comment: NEGATIVE
Comment: NEGATIVE
Comment: NORMAL
Neisseria Gonorrhea: NEGATIVE
Trichomonas: NEGATIVE

## 2021-08-01 LAB — RPR: RPR Ser Ql: NONREACTIVE

## 2021-08-01 LAB — HEPATITIS C ANTIBODY
Hepatitis C Ab: NONREACTIVE
SIGNAL TO CUT-OFF: 0.02 (ref <1.00)

## 2021-08-23 LAB — COLOGUARD: COLOGUARD: NEGATIVE

## 2021-09-04 ENCOUNTER — Ambulatory Visit: Payer: Commercial Managed Care - PPO | Admitting: Family Medicine

## 2021-10-02 DIAGNOSIS — J4520 Mild intermittent asthma, uncomplicated: Secondary | ICD-10-CM | POA: Diagnosis not present

## 2021-10-02 DIAGNOSIS — J0140 Acute pansinusitis, unspecified: Secondary | ICD-10-CM | POA: Diagnosis not present

## 2021-11-28 DIAGNOSIS — F331 Major depressive disorder, recurrent, moderate: Secondary | ICD-10-CM | POA: Diagnosis not present

## 2022-02-01 ENCOUNTER — Ambulatory Visit (INDEPENDENT_AMBULATORY_CARE_PROVIDER_SITE_OTHER): Payer: BC Managed Care – PPO | Admitting: Family Medicine

## 2022-02-01 ENCOUNTER — Encounter: Payer: Self-pay | Admitting: Family Medicine

## 2022-02-01 VITALS — BP 110/68 | HR 60 | Temp 98.2°F | Ht 72.0 in | Wt 243.8 lb

## 2022-02-01 DIAGNOSIS — E785 Hyperlipidemia, unspecified: Secondary | ICD-10-CM

## 2022-02-01 DIAGNOSIS — Z0000 Encounter for general adult medical examination without abnormal findings: Secondary | ICD-10-CM

## 2022-02-01 DIAGNOSIS — N529 Male erectile dysfunction, unspecified: Secondary | ICD-10-CM

## 2022-02-01 DIAGNOSIS — R739 Hyperglycemia, unspecified: Secondary | ICD-10-CM | POA: Diagnosis not present

## 2022-02-01 DIAGNOSIS — Z23 Encounter for immunization: Secondary | ICD-10-CM | POA: Diagnosis not present

## 2022-02-01 DIAGNOSIS — Z87891 Personal history of nicotine dependence: Secondary | ICD-10-CM

## 2022-02-01 DIAGNOSIS — R6882 Decreased libido: Secondary | ICD-10-CM

## 2022-02-01 MED ORDER — TADALAFIL 20 MG PO TABS: 20.0000 mg | ORAL_TABLET | ORAL | 11 refills | Status: DC | PRN

## 2022-02-01 NOTE — Patient Instructions (Addendum)
blood pressure looks good- will have him cut amlodipine in half to 2.5 mg through next visit and if BP still less than 115/80 may be able to stop completley next visit and just do olmesartan  Trial cialis  Schedule a lab visit at the check out desk within 2 weeks between 8-9 am. Return for future fasting labs meaning nothing but water after midnight please. Ok to take your medications with water.   Recommended follow up: Return in about 6 months (around 08/04/2022) for followup or sooner if needed.Schedule b4 you leave.
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
## 2022-02-13 ENCOUNTER — Encounter: Payer: Self-pay | Admitting: Family Medicine

## 2022-02-13 ENCOUNTER — Other Ambulatory Visit (INDEPENDENT_AMBULATORY_CARE_PROVIDER_SITE_OTHER): Payer: BC Managed Care – PPO

## 2022-02-13 DIAGNOSIS — R739 Hyperglycemia, unspecified: Secondary | ICD-10-CM

## 2022-02-13 DIAGNOSIS — Z87891 Personal history of nicotine dependence: Secondary | ICD-10-CM | POA: Diagnosis not present

## 2022-02-13 DIAGNOSIS — E785 Hyperlipidemia, unspecified: Secondary | ICD-10-CM

## 2022-02-13 DIAGNOSIS — Z0000 Encounter for general adult medical examination without abnormal findings: Secondary | ICD-10-CM | POA: Diagnosis not present

## 2022-02-13 DIAGNOSIS — R6882 Decreased libido: Secondary | ICD-10-CM

## 2022-02-13 DIAGNOSIS — N529 Male erectile dysfunction, unspecified: Secondary | ICD-10-CM | POA: Diagnosis not present

## 2022-02-13 LAB — COMPREHENSIVE METABOLIC PANEL
ALT: 23 U/L (ref 0–53)
AST: 21 U/L (ref 0–37)
Albumin: 4.1 g/dL (ref 3.5–5.2)
Alkaline Phosphatase: 60 U/L (ref 39–117)
BUN: 12 mg/dL (ref 6–23)
CO2: 27 mEq/L (ref 19–32)
Calcium: 9.5 mg/dL (ref 8.4–10.5)
Chloride: 100 mEq/L (ref 96–112)
Creatinine, Ser: 1.15 mg/dL (ref 0.40–1.50)
GFR: 74.83 mL/min (ref >60.00)
Glucose, Bld: 96 mg/dL (ref 70–99)
Potassium: 4.5 mEq/L (ref 3.5–5.1)
Sodium: 135 mEq/L (ref 135–145)
Total Bilirubin: 0.8 mg/dL (ref 0.2–1.2)
Total Protein: 6.9 g/dL (ref 6.0–8.3)

## 2022-02-13 LAB — URINALYSIS, ROUTINE W REFLEX MICROSCOPIC
Bilirubin Urine: NEGATIVE
Ketones, ur: NEGATIVE
Nitrite: NEGATIVE
Specific Gravity, Urine: 1.01 (ref 1.000–1.030)
Total Protein, Urine: NEGATIVE
Urine Glucose: NEGATIVE
Urobilinogen, UA: 0.2 (ref 0.0–1.0)
pH: 6 (ref 5.0–8.0)

## 2022-02-13 LAB — CBC WITH DIFFERENTIAL/PLATELET
Basophils Absolute: 0 10*3/uL (ref 0.0–0.1)
Basophils Relative: 0.8 % (ref 0.0–3.0)
Eosinophils Absolute: 0.1 10*3/uL (ref 0.0–0.7)
Eosinophils Relative: 1.5 % (ref 0.0–5.0)
HCT: 39.9 % (ref 39.0–52.0)
Hemoglobin: 13.7 g/dL (ref 13.0–17.0)
Lymphocytes Relative: 41.8 % (ref 12.0–46.0)
Lymphs Abs: 2.4 10*3/uL (ref 0.7–4.0)
MCHC: 34.4 g/dL (ref 30.0–36.0)
MCV: 91.2 fl (ref 78.0–100.0)
Monocytes Absolute: 0.7 10*3/uL (ref 0.1–1.0)
Monocytes Relative: 13 % — ABNORMAL HIGH (ref 3.0–12.0)
Neutro Abs: 2.5 10*3/uL (ref 1.4–7.7)
Neutrophils Relative %: 42.9 % — ABNORMAL LOW (ref 43.0–77.0)
Platelets: 264 10*3/uL (ref 150.0–400.0)
RBC: 4.38 Mil/uL (ref 4.22–5.81)
RDW: 13.2 % (ref 11.5–15.5)
WBC: 5.7 10*3/uL (ref 4.0–10.5)

## 2022-02-13 LAB — LDL CHOLESTEROL, DIRECT: Direct LDL: 76 mg/dL

## 2022-02-13 LAB — HEMOGLOBIN A1C: Hgb A1c MFr Bld: 5.5 % (ref 4.6–6.5)

## 2022-02-14 LAB — TESTOSTERONE TOTAL,FREE,BIO, MALES
Albumin: 4.3 g/dL (ref 3.6–5.1)
Sex Hormone Binding: 43 nmol/L (ref 10–50)
Testosterone, Bioavailable: 122.1 ng/dL (ref 110.0–575.0)
Testosterone, Free: 62 pg/mL (ref 46.0–224.0)
Testosterone: 561 ng/dL (ref 250–827)

## 2022-02-14 NOTE — Telephone Encounter (Signed)
See lab question, I have addressed PA question.

## 2022-02-18 ENCOUNTER — Other Ambulatory Visit: Payer: Self-pay

## 2022-02-18 MED ORDER — TADALAFIL 20 MG PO TABS: 20.0000 mg | ORAL_TABLET | ORAL | 11 refills | Status: AC | PRN

## 2022-04-05 ENCOUNTER — Other Ambulatory Visit: Payer: Self-pay | Admitting: Family Medicine

## 2022-04-07 ENCOUNTER — Other Ambulatory Visit: Payer: Self-pay | Admitting: Family Medicine

## 2022-04-25 DIAGNOSIS — F331 Major depressive disorder, recurrent, moderate: Secondary | ICD-10-CM | POA: Diagnosis not present

## 2022-06-17 DIAGNOSIS — B9689 Other specified bacterial agents as the cause of diseases classified elsewhere: Secondary | ICD-10-CM | POA: Diagnosis not present

## 2022-06-17 DIAGNOSIS — J3489 Other specified disorders of nose and nasal sinuses: Secondary | ICD-10-CM | POA: Diagnosis not present

## 2022-06-17 DIAGNOSIS — R0981 Nasal congestion: Secondary | ICD-10-CM | POA: Diagnosis not present

## 2022-06-20 ENCOUNTER — Encounter (HOSPITAL_COMMUNITY): Payer: Self-pay

## 2022-06-20 ENCOUNTER — Other Ambulatory Visit: Payer: Self-pay

## 2022-06-20 ENCOUNTER — Emergency Department (HOSPITAL_COMMUNITY)
Admission: EM | Admit: 2022-06-20 | Discharge: 2022-06-20 | Disposition: A | Discharge status: Home / Self Care | Payer: BC Managed Care – PPO | Attending: Emergency Medicine | Admitting: Emergency Medicine

## 2022-06-20 DIAGNOSIS — R310 Gross hematuria: Secondary | ICD-10-CM

## 2022-06-20 DIAGNOSIS — S3730XA Unspecified injury of urethra, initial encounter: Secondary | ICD-10-CM

## 2022-06-20 DIAGNOSIS — X58XXXA Exposure to other specified factors, initial encounter: Secondary | ICD-10-CM | POA: Diagnosis not present

## 2022-06-20 LAB — CBC WITH DIFFERENTIAL/PLATELET
Abs Immature Granulocytes: 0.05 10*3/uL (ref 0.00–0.07)
Basophils Absolute: 0.1 10*3/uL (ref 0.0–0.1)
Basophils Relative: 1 %
Eosinophils Absolute: 0.1 10*3/uL (ref 0.0–0.5)
Eosinophils Relative: 1 %
HCT: 41.3 % (ref 39.0–52.0)
Hemoglobin: 14.6 g/dL (ref 13.0–17.0)
Immature Granulocytes: 1 %
Lymphocytes Relative: 18 %
Lymphs Abs: 1.6 10*3/uL (ref 0.7–4.0)
MCH: 31.4 pg (ref 26.0–34.0)
MCHC: 35.4 g/dL (ref 30.0–36.0)
MCV: 88.8 fL (ref 80.0–100.0)
Monocytes Absolute: 1.1 10*3/uL — ABNORMAL HIGH (ref 0.1–1.0)
Monocytes Relative: 12 %
Neutro Abs: 6.3 10*3/uL (ref 1.7–7.7)
Neutrophils Relative %: 67 %
Platelets: 283 10*3/uL (ref 150–400)
RBC: 4.65 MIL/uL (ref 4.22–5.81)
RDW: 12.3 % (ref 11.5–15.5)
WBC: 9.2 10*3/uL (ref 4.0–10.5)
nRBC: 0 % (ref 0.0–0.2)

## 2022-06-20 LAB — BASIC METABOLIC PANEL
Anion gap: 11 (ref 5–15)
BUN: 16 mg/dL (ref 6–20)
CO2: 24 mmol/L (ref 22–32)
Calcium: 8.9 mg/dL (ref 8.9–10.3)
Chloride: 98 mmol/L (ref 98–111)
Creatinine, Ser: 1.18 mg/dL (ref 0.61–1.24)
GFR, Estimated: >60 mL/min (ref >60)
Glucose, Bld: 111 mg/dL — ABNORMAL HIGH (ref 70–99)
Potassium: 3.9 mmol/L (ref 3.5–5.1)
Sodium: 133 mmol/L — ABNORMAL LOW (ref 135–145)

## 2022-06-20 NOTE — ED Provider Notes (Signed)
Henry Houston   CSN: [id number] Arrival date & time: 06/20/22  1832     History  Chief Complaint  Patient presents with   Bleeding from [place] is a 52 y.o. male, history of urethral stricture, who presents to the ED secondary to blood coming from his penis today after attempting to catheterize himself.  Henry Houston states Henry Houston is diagnosed with a stricture multiple years ago by Henry Houston, and was told that Henry Houston needed to catheterize himself every 2 days, Henry Houston has not been doing this as instructed cannot, and has only been cathing himself every 2 weeks.  Henry Houston states that Henry Houston waited about 2 weeks to cath himself, and today Henry Houston attempted to cath himself, and inserted the In-N-Out cath and had severe pain and bleeding.  Henry Houston states since then Henry Houston has been able to urinate, but it is extremely painful to urinate.  Denies being on any blood thinners, no lightheadedness or dizziness.     Home Medications Prior to Admission medications   Medication Sig Start Date End Date Taking? Authorizing Provider  amLODipine (NORVASC) 5 MG tablet TAKE 1 TABLET BY MOUTH EVERY DAY 04/05/22   Marin Olp, MD  atorvastatin (LIPITOR) 20 MG tablet Take 1 tablet (20 mg total) by mouth daily. 07/31/21   Marin Olp, MD  buPROPion (WELLBUTRIN XL) 150 MG 24 hr tablet Take 450 mg by mouth daily. Take three tablets daily. 01/13/14   [provider]  olmesartan (BENICAR) 40 MG tablet TAKE 1 TABLET BY MOUTH EVERY DAY 04/08/22   Marin Olp, MD  omeprazole (PRILOSEC) 20 MG capsule Take 20 mg by mouth daily.    [provider]  simvastatin (ZOCOR) 20 MG tablet TAKE 1 TABLET BY MOUTH EVERYDAY AT BEDTIME 04/08/22   Marin Olp, MD  tadalafil (CIALIS) 20 MG tablet Take 1 tablet (20 mg total) by mouth every other day as needed for erectile dysfunction. 02/18/22   Marin Olp, MD  venlafaxine XR (EFFEXOR-XR) 75 MG 24 hr capsule Take 75  mg by mouth daily.  02/12/18   [provider]  zolpidem (AMBIEN CR) 12.5 MG CR tablet Take 12.5 mg by mouth at bedtime. 02/12/18   [provider]      Allergies    Levaquin  [levofloxacin in d5w]    Review of Systems   Review of Systems  Genitourinary:  Positive for hematuria. Negative for penile discharge.    Physical Exam Updated Vital Signs BP (!) 159/90   Pulse 86   Temp 98.2 F (36.8 C) (Oral)   Resp 18   SpO2 98%  Physical Exam Vitals and nursing Houston reviewed.  Constitutional:      General: Henry Houston is not in acute distress.    Appearance: Henry Houston is well-developed.  HENT:     Head: Normocephalic and atraumatic.  Eyes:     General:        Right eye: No discharge.        Left eye: No discharge.     Conjunctiva/sclera: Conjunctivae normal.  Cardiovascular:     Rate and Rhythm: Normal rate and regular rhythm.     Heart sounds: No murmur heard. Pulmonary:     Effort: Pulmonary effort is normal. No respiratory distress.     Breath sounds: Normal breath sounds.  Abdominal:     Palpations: Abdomen is soft.     Tenderness: There is no abdominal tenderness.  Musculoskeletal:  General: No swelling.     Cervical back: Neck supple.  Skin:    General: Skin is warm and dry.     Capillary Refill: Capillary refill takes less than 2 seconds.  Neurological:     Mental Status: Henry Houston is alert.     Comments: Clear speech.   Psychiatric:        Mood and Affect: Mood normal.        Behavior: Behavior normal.        Thought Content: Thought content normal.     ED Results / Procedures / Treatments   Labs (all labs ordered are listed, but only abnormal results are displayed) Labs Reviewed  BASIC METABOLIC PANEL - Abnormal; Notable for the following components:      Result Value   Sodium 133 (*)    Glucose, Bld 111 (*)    All other components within normal limits  CBC WITH DIFFERENTIAL/PLATELET - Abnormal; Notable for the following components:   Monocytes  Absolute 1.1 (*)    All other components within normal limits    EKG None  Radiology No results found.  Procedures Procedures    Medications Ordered in ED Medications - No data to display  ED Course/ Medical Decision Making/ A&P                           Medical Decision Making Patient is a 52 year old male, here for blood in his urine after using an In-N-Out catheter and having pain.  Henry Houston states Henry Houston is able to urinate, and his bladder scan while in the ER, Henry Houston had 200 mL in his bladder, and the bar, and is able to urinate about 100 mL out.  Henry Houston is still making urine, and this is the second time Henry Houston is urinated since the trauma.  Is painful to urinate.  Discussed with Dr. Vanita Panda, Henry Houston recommended call urology on-call. I spoke to Dr. Milford Cage, Henry Houston recommends having him follow-up in the office tomorrow morning, and call right away.  Henry Houston has seen Henry Houston in the past.  I discussed with patient, labs show normal hemoglobin.  I discussed importance of hydration, and then importance of urinating even with the pain to prevent any bladder retention.  Henry Houston voiced understanding and return precautions were emphasized.  Amount and/or Complexity of Data Reviewed Labs: ordered.    Details: Hemoglobin within normal limits, creatinine normal.     Final Clinical Impression(s) / ED Diagnoses Final diagnoses:  Trauma of urethra, initial encounter  Gross hematuria    Rx / DC Orders ED Discharge Orders     None         Aleisha Paone, Si Gaul, PA 06/20/22 2117    Carmin Muskrat, MD 06/20/22 2346

## 2022-06-20 NOTE — ED Provider Triage Note (Signed)
Emergency Medicine Provider Triage Evaluation Note  Henry Houston , a 52 y.o. male  was evaluated in triage.  Patient complaining of blood from his penis.  Reports history of urinary stricture and is supposed to catheterize at home.  He catheterized today and noted blood.  Review of Systems  Positive:  Negative:   Physical Exam  BP (!) 150/99 (BP Location: Right Arm)   Pulse 98   Temp 98.2 F (36.8 C) (Oral)   Resp 18   SpO2 100%  Gen:   Awake, no distress   Resp:  Normal effort  MSK:   Moves extremities without difficult Other:  Chaperone used in triage.  Some dark blood on patient's underwear and at the meatus  Medical Decision Making  Medically screening exam initiated at 6:46 PM.  Appropriate orders placed.  Henry Houston was informed that the remainder of the evaluation will be completed by another provider, this initial triage assessment does not replace that evaluation, and the importance of remaining in the ED until their evaluation is complete.     Rhae Hammock, [place] 06/20/22 1847

## 2022-06-20 NOTE — ED Triage Notes (Signed)
Pt states he has significant urologic hx with urethral stricture. Pt states he requires in and out catheter once every two weeks to keep his urethra open and while performing it today he noticed dark blood thereafter.

## 2022-06-20 NOTE — Discharge Instructions (Addendum)
Please call the office tomorrow of the urologist, did try to get an so they can further evaluate you.  If you cannot urinate after 8 hours, please return to the ER as you may be retaining urine.  Avoid cathing if possible to prevent further trauma to urethra.  You may have blood in your urine but if you become dizzy, short of breath, weak please return to the ER.

## 2022-06-21 DIAGNOSIS — N35011 Post-traumatic bulbous urethral stricture: Secondary | ICD-10-CM | POA: Diagnosis not present

## 2022-06-21 DIAGNOSIS — R8271 Bacteriuria: Secondary | ICD-10-CM | POA: Diagnosis not present

## 2022-07-09 ENCOUNTER — Other Ambulatory Visit: Payer: Self-pay | Admitting: Urology

## 2022-07-12 NOTE — Patient Instructions (Signed)
SURGICAL WAITING ROOM VISITATION Patients having surgery or a procedure may have no more than 2 support people in the waiting area - these visitors may rotate in the visitor waiting room.   Due to an increase in RSV and influenza rates and associated hospitalizations, children ages 47 and under may not visit patients in [place]. If the patient needs to stay at the hospital during part of their recovery, the visitor guidelines for inpatient rooms apply.  PRE-OP VISITATION  Pre-op nurse will coordinate an appropriate time for 1 support person to accompany the patient in pre-op.  This support person may not rotate.  This visitor will be contacted when the time is appropriate for the visitor to come back in the pre-op area.  Please refer to the [facility] website for the visitor guidelines for Inpatients (after your surgery is over and you are in a regular room).  You are not required to quarantine at this time prior to your surgery. However, you must do this: Hand Hygiene often Do NOT share personal items Notify your provider if you are in close contact with someone who has COVID or you develop fever 100.4 or greater, new onset of sneezing, cough, sore throat, shortness of breath or body aches.  If you test positive for Covid or have been in contact with anyone that has tested positive in the last 10 days please notify you surgeon.    Your procedure is scheduled on:  Thursday  July 18, 2022  Report to [facility] Main Entrance: [place] entrance where the [employer] is available.   Report to admitting at: 07:15  AM  +++++Call this number if you have any questions or problems the morning of surgery [phone]  DO NOT EAT OR DRINK ANYTHING AFTER MIDNIGHT THE NIGHT PRIOR TO YOUR SURGERY / PROCEDURE.   FOLLOW BOWEL PREP AND ANY ADDITIONAL PRE OP INSTRUCTIONS YOU RECEIVED FROM YOUR SURGEON'S OFFICE!!!   Oral Hygiene is also important to reduce your risk of  infection.        Remember - BRUSH YOUR TEETH THE MORNING OF SURGERY WITH YOUR REGULAR TOOTHPASTE  Do NOT smoke after Midnight the night before surgery.  Take ONLY these medicines the morning of surgery with A SIP OF WATER: omeprazole (Prilosec), amlodipine, Venlafaxine (Effexor-XR), Bupropion (Wellbutrin)                    You may not have any metal on your body including jewelry, and body piercing  Do not wear lotions, powders, cologne, or deodorant  Men may shave face and neck.  Patients discharged on the day of surgery will not be allowed to drive home.  Someone NEEDS to stay with you for the first 24 hours after anesthesia.  Do not bring your home medications to the hospital. The Pharmacy will dispense medications listed on your medication list to you during your admission in the Hospital.  Please read over the following fact sheets you were given: IF YOU HAVE QUESTIONS ABOUT YOUR PRE-OP INSTRUCTIONS, PLEASE CALL [phone]  ([place])   [place] - Preparing for Surgery Before surgery, you can play an important role.  Because skin is not sterile, your skin needs to be as free of germs as possible.  You can reduce the number of germs on your skin by washing with CHG (chlorahexidine gluconate) soap before surgery.  CHG is an antiseptic cleaner which kills germs and bonds with the skin to continue killing germs even  after washing. Please DO NOT use if you have an allergy to CHG or antibacterial soaps.  If your skin becomes reddened/irritated stop using the CHG and inform your nurse when you arrive at Short Stay. Do not shave (including legs and underarms) for at least 48 hours prior to the first CHG shower.  You may shave your face/neck.  Please follow these instructions carefully:  1.  Shower with CHG Soap the night before surgery and the  morning of surgery.  2.  If you choose to wash your hair, wash your hair first as usual with your normal  shampoo.  3.  After you shampoo, rinse  your hair and body thoroughly to remove the shampoo.                             4.  Use CHG as you would any other liquid soap.  You can apply chg directly to the skin and wash.  Gently with a scrungie or clean washcloth.  5.  Apply the CHG Soap to your body ONLY FROM THE NECK DOWN.   Do not use on face/ open                           Wound or open sores. Avoid contact with eyes, ears mouth and genitals (private parts).                       Wash face,  Genitals (private parts) with your normal soap.             6.  Wash thoroughly, paying special attention to the area where your  surgery  will be performed.  7.  Thoroughly rinse your body with warm water from the neck down.  8.  DO NOT shower/wash with your normal soap after using and rinsing off the CHG Soap.            9.  Pat yourself dry with a clean towel.            10.  Wear clean pajamas.            11.  Place clean sheets on your bed the night of your first shower and do not  sleep with pets.  ON THE DAY OF SURGERY : Do not apply any lotions/deodorants the morning of surgery.  Please wear clean clothes to the hospital/surgery center.    FAILURE TO FOLLOW THESE INSTRUCTIONS MAY RESULT IN THE CANCELLATION OF YOUR SURGERY  PATIENT SIGNATURE_________________________________  NURSE SIGNATURE__________________________________  ________________________________________________________________________

## 2022-07-12 NOTE — Progress Notes (Signed)
COVID Vaccine received:  '[]'$  No '[x]'$  Yes Date of any COVID positive Test in last 90 days:  None  PCP - Garret Reddish, MD    [phone] Cardiologist - none  Chest x-ray -  EKG -  will do at PST Stress Test -  ECHO -  Cardiac Cath -   PCR screen: '[]'$  Ordered & Completed                      '[]'$   No Order but Needs PROFEND                      '[x]'$   N/A for this surgery  Surgery Plan:  '[x]'$  Ambulatory                            '[]'$  Outpatient in bed                            '[]'$  Admit  Anesthesia:    '[x]'$  General  '[]'$  Spinal                           '[]'$   Choice '[]'$   MAC  Bowel Prep - '[x]'$  No  '[]'$   Yes _____________  Pacemaker / ICD device '[x]'$  No '[]'$  Yes        Device order form faxed '[x]'$  No    '[]'$   Yes      Faxed to:  Spinal Cord Stimulator:'[x]'$  No '[]'$  Yes      (Remind patient to bring remote DOS) Other Implants:   History of Sleep Apnea? '[x]'$  No '[]'$  Yes   CPAP used?- '[x]'$  No '[]'$  Yes    Does the patient monitor blood sugar? '[]'$  No '[]'$  Yes  '[x]'$  N/A  Blood Thinner / Instructions:  none Aspirin Instructions: none  ERAS Protocol Ordered: '[x]'$  No  '[]'$  Yes Patient is to be NPO after: midnight prior  Activity level: Patient can climb a flight of stairs without difficulty; '[x]'$  No CP  '[x]'$  No SOB, Patient can perform ADLs without assistance.   Anesthesia review: HTN, GERD, anxiety,   Patient denies shortness of breath, fever, cough and chest pain at PAT appointment.  Patient verbalized understanding and agreement to the Pre-Surgical Instructions that were given to them at this PAT appointment. Patient was also educated of the need to review these PAT instructions again prior to his/her surgery.I reviewed the appropriate phone numbers to call if they have any and questions or concerns.

## 2022-07-15 ENCOUNTER — Other Ambulatory Visit: Payer: Self-pay

## 2022-07-15 ENCOUNTER — Encounter (HOSPITAL_COMMUNITY)
Admission: RE | Admit: 2022-07-15 | Discharge: 2022-07-15 | Disposition: A | Discharge status: Home / Self Care | Payer: BC Managed Care – PPO | Source: Ambulatory Visit | Attending: Urology | Admitting: Urology

## 2022-07-15 ENCOUNTER — Encounter (HOSPITAL_COMMUNITY): Payer: Self-pay

## 2022-07-15 VITALS — BP 120/77 | HR 65 | Temp 97.8°F | Resp 16 | Ht 72.0 in | Wt 248.0 lb

## 2022-07-15 DIAGNOSIS — Z01818 Encounter for other preprocedural examination: Secondary | ICD-10-CM

## 2022-07-15 DIAGNOSIS — N35912 Unspecified bulbous urethral stricture, male: Secondary | ICD-10-CM | POA: Diagnosis not present

## 2022-07-15 DIAGNOSIS — Z01810 Encounter for preprocedural cardiovascular examination: Secondary | ICD-10-CM | POA: Insufficient documentation

## 2022-07-15 DIAGNOSIS — Z87891 Personal history of nicotine dependence: Secondary | ICD-10-CM | POA: Diagnosis not present

## 2022-07-15 DIAGNOSIS — E669 Obesity, unspecified: Secondary | ICD-10-CM | POA: Diagnosis not present

## 2022-07-15 DIAGNOSIS — Z6833 Body mass index (BMI) 33.0-33.9, adult: Secondary | ICD-10-CM | POA: Diagnosis not present

## 2022-07-15 DIAGNOSIS — I10 Essential (primary) hypertension: Secondary | ICD-10-CM | POA: Diagnosis not present

## 2022-07-15 DIAGNOSIS — K219 Gastro-esophageal reflux disease without esophagitis: Secondary | ICD-10-CM | POA: Diagnosis not present

## 2022-07-15 DIAGNOSIS — N35919 Unspecified urethral stricture, male, unspecified site: Secondary | ICD-10-CM

## 2022-07-15 HISTORY — DX: Pneumonia, unspecified organism: J18.9

## 2022-07-17 NOTE — Anesthesia Preprocedure Evaluation (Signed)
Anesthesia Evaluation  Patient identified by MRN, date of birth, ID band Patient awake    Reviewed: Allergy & Precautions, NPO status , Patient's Chart, lab work & pertinent test results  Airway Mallampati: II  TM Distance: >3 FB Neck ROM: Full    Dental no notable dental hx. (+) Teeth Intact, Dental Advisory Given   Pulmonary former smoker   Pulmonary exam normal breath sounds clear to auscultation       Cardiovascular hypertension, Normal cardiovascular exam Rhythm:Regular Rate:Normal     Neuro/Psych negative neurological ROS     GI/Hepatic ,GERD  ,,  Endo/Other    Renal/GU Lab Results      Component                Value               Date                      CREATININE               1.18                06/20/2022                BUN                      16                  06/20/2022                NA                       133 (L)             06/20/2022                K                        3.9                 06/20/2022                  Urethral stricture    Musculoskeletal   Abdominal  (+) + obese  Peds  Hematology   Anesthesia Other Findings All: levoquin  Reproductive/Obstetrics                             Anesthesia Physical Anesthesia Plan  ASA: 2  Anesthesia Plan: General   Post-op Pain Management: Tylenol PO (pre-op)*   Induction: Intravenous  PONV Risk Score and Plan: 3 and Treatment may vary due to age or medical condition, Midazolam, Dexamethasone and Ondansetron  Airway Management Planned: LMA  Additional Equipment: None  Intra-op Plan:   Post-operative Plan: Extubation in OR  Informed Consent: I have reviewed the patients History and Physical, chart, labs and discussed the procedure including the risks, benefits and alternatives for the proposed anesthesia with the patient or authorized representative who has indicated his/her understanding and acceptance.      Dental advisory given  Plan Discussed with: CRNA, Surgeon and Anesthesiologist  Anesthesia Plan Comments:         Anesthesia Quick Evaluation

## 2022-07-17 NOTE — Progress Notes (Addendum)
PT called preop dept today and LVMM stating he "felt like he was coming down with something".  Called pt back and instructed pt to test himself for covid and to call [facility] and ask to speak to surgery scheduler and let her know the results and she will inform DR Louis Meckel .  DR Louis Meckel will decide how to proceed.  Preop nurse informed pt that if he is pos for covid  he would have to wait 10 days before procedure could be done but Dr Louis Meckel would determine hot to proceed.  PT voiced understanding.  PReop nurse called and spoke with surgery scheduler, Zona and made her aware.   PT called back on 07/17/22 and LVMM.  Stated covid test was negative and that he has LVMM for Marlis Edelson, Scheduler at office.

## 2022-07-18 ENCOUNTER — Encounter (HOSPITAL_COMMUNITY)
Admission: RE | Disposition: A | Discharge status: Home / Self Care | Payer: Self-pay | Source: Home / Self Care | Attending: Urology

## 2022-07-18 ENCOUNTER — Other Ambulatory Visit: Payer: Self-pay

## 2022-07-18 ENCOUNTER — Ambulatory Visit (HOSPITAL_COMMUNITY): Payer: BC Managed Care – PPO | Admitting: Anesthesiology

## 2022-07-18 ENCOUNTER — Encounter (HOSPITAL_COMMUNITY): Payer: Self-pay | Admitting: Urology

## 2022-07-18 ENCOUNTER — Ambulatory Visit (HOSPITAL_COMMUNITY)
Admission: RE | Admit: 2022-07-18 | Discharge: 2022-07-18 | Disposition: A | Discharge status: Home / Self Care | Payer: BC Managed Care – PPO | Attending: Urology | Admitting: Urology

## 2022-07-18 ENCOUNTER — Ambulatory Visit (HOSPITAL_COMMUNITY): Payer: BC Managed Care – PPO

## 2022-07-18 DIAGNOSIS — N35912 Unspecified bulbous urethral stricture, male: Secondary | ICD-10-CM | POA: Insufficient documentation

## 2022-07-18 DIAGNOSIS — K219 Gastro-esophageal reflux disease without esophagitis: Secondary | ICD-10-CM | POA: Diagnosis not present

## 2022-07-18 DIAGNOSIS — I10 Essential (primary) hypertension: Secondary | ICD-10-CM | POA: Diagnosis not present

## 2022-07-18 DIAGNOSIS — E669 Obesity, unspecified: Secondary | ICD-10-CM | POA: Diagnosis not present

## 2022-07-18 DIAGNOSIS — Z6833 Body mass index (BMI) 33.0-33.9, adult: Secondary | ICD-10-CM | POA: Insufficient documentation

## 2022-07-18 DIAGNOSIS — N35919 Unspecified urethral stricture, male, unspecified site: Secondary | ICD-10-CM | POA: Diagnosis not present

## 2022-07-18 DIAGNOSIS — Z87891 Personal history of nicotine dependence: Secondary | ICD-10-CM | POA: Insufficient documentation

## 2022-07-18 HISTORY — PX: CYSTOSCOPY WITH RETROGRADE URETHROGRAM: SHX6309

## 2022-07-18 SURGERY — CYSTOSCOPY WITH RETROGRADE URETHROGRAM
Anesthesia: General

## 2022-07-18 MED ORDER — HYDROMORPHONE HCL 1 MG/ML IJ SOLN: 0.2500 mg | INTRAMUSCULAR | Status: DC | PRN

## 2022-07-18 MED ORDER — DEXAMETHASONE SODIUM PHOSPHATE 10 MG/ML IJ SOLN
INTRAMUSCULAR | Status: DC | PRN
  Administered 2022-07-18: 4 mg via INTRAVENOUS

## 2022-07-18 MED ORDER — MELOXICAM 15 MG PO TABS: 15.0000 mg | ORAL_TABLET | Freq: Every day | ORAL | 0 refills | Status: DC

## 2022-07-18 MED ORDER — OXYCODONE HCL 5 MG/5ML PO SOLN: 5.0000 mg | Freq: Once | ORAL | Status: DC | PRN

## 2022-07-18 MED ORDER — CEFAZOLIN SODIUM-DEXTROSE 2-4 GM/100ML-% IV SOLN
2.0000 g | INTRAVENOUS | Status: AC
  Administered 2022-07-18: 2 g via INTRAVENOUS
  Filled 2022-07-18: qty 100

## 2022-07-18 MED ORDER — ORAL CARE MOUTH RINSE: 15.0000 mL | Freq: Once | OROMUCOSAL | Status: AC

## 2022-07-18 MED ORDER — ONDANSETRON HCL 4 MG/2ML IJ SOLN
INTRAMUSCULAR | Status: DC | PRN
  Administered 2022-07-18: 4 mg via INTRAVENOUS

## 2022-07-18 MED ORDER — ACETAMINOPHEN 10 MG/ML IV SOLN: 1000.0000 mg | Freq: Once | INTRAVENOUS | Status: DC | PRN

## 2022-07-18 MED ORDER — ONDANSETRON HCL 4 MG/2ML IJ SOLN
INTRAMUSCULAR | Status: AC
  Filled 2022-07-18: qty 2

## 2022-07-18 MED ORDER — PROPOFOL 10 MG/ML IV BOLUS
INTRAVENOUS | Status: AC
  Filled 2022-07-18: qty 20

## 2022-07-18 MED ORDER — LACTATED RINGERS IV SOLN: INTRAVENOUS | Status: DC

## 2022-07-18 MED ORDER — FENTANYL CITRATE (PF) 100 MCG/2ML IJ SOLN
INTRAMUSCULAR | Status: DC | PRN
  Administered 2022-07-18: 50 ug via INTRAVENOUS

## 2022-07-18 MED ORDER — CHLORHEXIDINE GLUCONATE 0.12 % MT SOLN
15.0000 mL | Freq: Once | OROMUCOSAL | Status: AC
  Administered 2022-07-18: 15 mL via OROMUCOSAL

## 2022-07-18 MED ORDER — SODIUM CHLORIDE 0.9 % IR SOLN
Status: DC | PRN
  Administered 2022-07-18: 3000 mL

## 2022-07-18 MED ORDER — DEXAMETHASONE SODIUM PHOSPHATE 10 MG/ML IJ SOLN
INTRAMUSCULAR | Status: AC
  Filled 2022-07-18: qty 1

## 2022-07-18 MED ORDER — PROPOFOL 10 MG/ML IV BOLUS
INTRAVENOUS | Status: DC | PRN
  Administered 2022-07-18: 200 mg via INTRAVENOUS

## 2022-07-18 MED ORDER — TAMSULOSIN HCL 0.4 MG PO CAPS: 0.4000 mg | ORAL_CAPSULE | Freq: Every day | ORAL | 0 refills | Status: DC

## 2022-07-18 MED ORDER — OXYCODONE HCL 5 MG PO TABS: 5.0000 mg | ORAL_TABLET | Freq: Once | ORAL | Status: DC | PRN

## 2022-07-18 MED ORDER — LIDOCAINE HCL (PF) 2 % IJ SOLN
INTRAMUSCULAR | Status: AC
  Filled 2022-07-18: qty 5

## 2022-07-18 MED ORDER — OXYMETAZOLINE HCL 0.05 % NA SOLN: 2.0000 | Freq: Once | NASAL | Status: AC

## 2022-07-18 MED ORDER — 0.9 % SODIUM CHLORIDE (POUR BTL) OPTIME
TOPICAL | Status: DC | PRN
  Administered 2022-07-18: 1000 mL

## 2022-07-18 MED ORDER — LIDOCAINE 2% (20 MG/ML) 5 ML SYRINGE
INTRAMUSCULAR | Status: DC | PRN
  Administered 2022-07-18: 100 mg via INTRAVENOUS

## 2022-07-18 MED ORDER — OXYMETAZOLINE HCL 0.05 % NA SOLN
NASAL | Status: AC
  Administered 2022-07-18: 2 via NASAL
  Filled 2022-07-18: qty 30

## 2022-07-18 MED ORDER — MIDAZOLAM HCL 2 MG/2ML IJ SOLN
INTRAMUSCULAR | Status: DC | PRN
  Administered 2022-07-18: 2 mg via INTRAVENOUS

## 2022-07-18 MED ORDER — FENTANYL CITRATE (PF) 100 MCG/2ML IJ SOLN
INTRAMUSCULAR | Status: AC
  Filled 2022-07-18: qty 2

## 2022-07-18 MED ORDER — AMISULPRIDE (ANTIEMETIC) 5 MG/2ML IV SOLN: 10.0000 mg | Freq: Once | INTRAVENOUS | Status: DC | PRN

## 2022-07-18 MED ORDER — MIDAZOLAM HCL 2 MG/2ML IJ SOLN
INTRAMUSCULAR | Status: AC
  Filled 2022-07-18: qty 2

## 2022-07-18 MED ORDER — ONDANSETRON HCL 4 MG/2ML IJ SOLN: 4.0000 mg | Freq: Once | INTRAMUSCULAR | Status: DC | PRN

## 2022-07-18 MED ORDER — IOHEXOL 300 MG/ML  SOLN
INTRAMUSCULAR | Status: DC | PRN
  Administered 2022-07-18: 70 mL

## 2022-07-18 SURGICAL SUPPLY — 26 items
BAG URINE DRAIN 2000ML AR STRL (UROLOGICAL SUPPLIES) ×1 IMPLANT
BALLN NEPHROSTOMY (BALLOONS)
BALLN OPTILUME DCB 30X5X75 (BALLOONS) ×1
BALLOON NEPHROSTOMY (BALLOONS) IMPLANT
BALLOON OPTILUME DCB 30X5X75 (BALLOONS) IMPLANT
CATH FOLEY 2W COUNCIL 20FR 5CC (CATHETERS) IMPLANT
CATH FOLEY 2WAY SLVR  5CC 14FR (CATHETERS) ×1
CATH FOLEY 2WAY SLVR 5CC 14FR (CATHETERS) IMPLANT
CATH ROBINSON RED A/P 14FR (CATHETERS) ×1 IMPLANT
CATH URET 5FR 28IN CONE TIP (BALLOONS)
CATH URET 5FR 70CM CONE TIP (BALLOONS) IMPLANT
CATH URETL OPEN END 6FR 70 (CATHETERS) IMPLANT
CLOTH BEACON ORANGE TIMEOUT ST (SAFETY) ×1 IMPLANT
DEVICE INFLATION ATRION QL4015 (MISCELLANEOUS) IMPLANT
GLOVE BIO SURGEON STRL SZ7.5 (GLOVE) ×1 IMPLANT
GOWN STRL REUS W/ TWL LRG LVL3 (GOWN DISPOSABLE) ×2 IMPLANT
GOWN STRL REUS W/ TWL XL LVL3 (GOWN DISPOSABLE) ×1 IMPLANT
GOWN STRL REUS W/TWL LRG LVL3 (GOWN DISPOSABLE) ×2
GOWN STRL REUS W/TWL XL LVL3 (GOWN DISPOSABLE) ×1
GUIDEWIRE ANG ZIPWIRE 038X150 (WIRE) IMPLANT
GUIDEWIRE STR DUAL SENSOR (WIRE) ×1 IMPLANT
MANIFOLD NEPTUNE II (INSTRUMENTS) IMPLANT
NS IRRIG 1000ML POUR BTL (IV SOLUTION) IMPLANT
PACK CYSTO (CUSTOM PROCEDURE TRAY) ×1 IMPLANT
PENCIL SMOKE EVACUATOR (MISCELLANEOUS) IMPLANT
WATER STERILE IRR 3000ML UROMA (IV SOLUTION) ×1 IMPLANT

## 2022-07-18 NOTE — Interval H&P Note (Signed)
History and Physical Interval Note:  07/18/2022 9:17 AM  Henry Houston  has presented today for surgery, with the diagnosis of URETHRAL STRICTURE.  The various methods of treatment have been discussed with the patient and family. After consideration of risks, benefits and other options for treatment, the patient has consented to  Procedure(s) with comments: CYSTOSCOPY WITH RETROGRADE URETHROGRAM, URETHRAL BALLOON DILATION (N/A) - 45 MINUTES NEEDED FOR CASE as a surgical intervention.  The patient's history has been reviewed, patient examined, no change in status, stable for surgery.  I have reviewed the patient's chart and labs.  Questions were answered to the patient's satisfaction.     [name]

## 2022-07-18 NOTE — Op Note (Signed)
Preoperative diagnosis:  bulbar urethral stricture   Postoperative diagnosis:  Same   Procedure: Retrograde urethrogram with interpretation Cystourethroscopy Urethral stricture balloon dilation, OPTi lumen   Surgeon: [name], MD   Anesthesia: General   Complications: None   Intraoperative findings:  #1:  Retrograde urethrogram demonstrasted a 15m bulbar urethral stricture - 34F.  The remainder of the urethra was normal.      EBL: Minimal   Specimens: None   Indication: Henry Sherrinis a  52y.o.  patient with history of urethral stricture.  After reviewing the management options for treatment, he elected to proceed with the above surgical procedure(s). We have discussed the potential benefits and risks of the procedure, side effects of the proposed treatment, the likelihood of the patient achieving the goals of the procedure, and any potential problems that might occur during the procedure or recuperation. Informed consent has been obtained.   Description of procedure:   The patient was taken to the operating room and general anesthesia was induced.  The patient was placed in the dorsal lithotomy position, prepped and draped in the usual sterile fashion, and preoperative antibiotics were administered. A preoperative time-out was performed.    A 14 French Foley catheter was inserted into the fossa navicularis and inflated with 3 cc of sterile water.  Then with the penis on stretch the C arm was rotated slightly and a retrograde urethrogram was performed with the above findings.  I then deflated the balloon and remove the catheter.  I subsequently passed the cystoscope through the patient's urethra and up to the stricture.  I then advanced a wire through the scope and across the stricture.  I confirmed its position on fluoroscopy.  I then removed the scope leaving the wire behind.  I then repassed the scope alongside the wire and passed the Optilume balloon dilator  kit over the wire and across the stricture under visual guidance.  I then inflated the balloon to 10 cc H2O and left it inflated for 5 minutes.  I then deflated the balloon and passed the cystoscope alongside the wire inspecting the area.  I did not pass the scope past this area.  Recognizing a widely patent urethra I remove the scope and passed a 14 French Foley catheter.  The urine was noted to efflux nice and clear.  The patient was subsequently awoken and returned to PACU stable condition.    BArdis Hughs M.D.

## 2022-07-18 NOTE — Anesthesia Procedure Notes (Signed)
Procedure Name: LMA Insertion Date/Time: 07/18/2022 9:31 AM  Performed by: Niel Hummer, CRNAPre-anesthesia Checklist: Patient identified, Emergency Drugs available, Suction available and Patient being monitored Patient Re-evaluated:Patient Re-evaluated prior to induction Oxygen Delivery Method: Circle system utilized Preoxygenation: Pre-oxygenation with 100% oxygen Induction Type: IV induction LMA: LMA with gastric port inserted LMA Size: 4.0 Number of attempts: 1 Dental Injury: Teeth and Oropharynx as per pre-operative assessment

## 2022-07-18 NOTE — Discharge Instructions (Addendum)
Removal of catheter Remove the foley catheter in 24 hours.  This can be done easily by cutting the side port of the catheter, which will allow the balloon to deflate.  You will see 1-2 teaspoons of clear water as the balloon deflates and then the catheter can be slid out without difficulty.        Cut here   Foley Catheter Care A soft, flexible tube (Foley catheter) may have been placed in your bladder to drain urine and fluid. Follow these instructions: Taking Care of the Catheter Keep the area where the catheter leaves your body clean.  Attach the catheter to the leg so there is no tension on the catheter.  Keep the drainage bag below the level of the bladder, but keep it OFF the floor.  Do not take long soaking baths. Your caregiver will give instructions about showering.  Wash your hands before touching ANYTHING related to the catheter or bag.  Using mild soap and warm water on a washcloth:  Clean the area closest to the catheter insertion site using a circular motion around the catheter.  Clean the catheter itself by wiping AWAY from the insertion site for several inches down the tube.  NEVER wipe upward as this could sweep bacteria up into the urethra (tube in your body that normally drains the bladder) and cause infection.  Place a small amount of sterile lubricant at the tip of the penis where the catheter is entering.  Taking Care of the Drainage Bags Two drainage bags may be taken home: a large overnight drainage bag, and a smaller leg bag which fits underneath clothing.  It is okay to wear the overnight bag at any time, but NEVER wear the smaller leg bag at night.  Keep the drainage bag well below the level of your bladder. This prevents backflow of urine into the bladder and allows the urine to drain freely.  Anchor the tubing to your leg to prevent pulling or tension on the catheter. Use tape or a leg strap provided by the hospital.  Empty the drainage bag when it is 1/2 to 3/4  full. Wash your hands before and after touching the bag.  Periodically check the tubing for kinks to make sure there is no pressure on the tubing which could restrict the flow of urine.  Changing the Drainage Bags Cleanse both ends of the clean bag with alcohol before changing.  Pinch off the rubber catheter to avoid urine spillage during the disconnection.  Disconnect the dirty bag and connect the clean one.  Empty the dirty bag carefully to avoid a urine spill.  Attach the new bag to the leg with tape or a leg strap.  Cleaning the Drainage Bags Whenever a drainage bag is disconnected, it must be cleaned quickly so it is ready for the next use.  Wash the bag in warm, soapy water.  Rinse the bag thoroughly with warm water.  Soak the bag for 30 minutes in a solution of white vinegar and water (1 cup vinegar to 1 quart warm water).  Rinse with warm water.  SEEK MEDICAL CARE IF:  You have chills or night sweats.  You are leaking around your catheter or have problems with your catheter. It is not uncommon to have sporadic leakage around your catheter as a result of bladder spasms. If the leakage stops, there is not much need for concern. If you are uncertain, call your caregiver.  You develop side effects that you think are  coming from your medicines.  SEEK IMMEDIATE MEDICAL CARE IF:  You are suddenly unable to urinate. Check to see if there are any kinks in the drainage tubing that may cause this. If you cannot find any kinks, call your caregiver immediately. This is an emergency.  You develop shortness of breath or chest pains.  Bleeding persists or clots develop in your urine.  You have a fever.  You develop pain in your back or over your lower belly (abdomen).  You develop pain or swelling in your legs.  Any problems you are having get worse rather than better.  MAKE SURE YOU:  Understand these instructions.  Will watch your condition.  Will get help right away if you are not doing well  or get worse.

## 2022-07-18 NOTE — H&P (Signed)
52 year old male with a history of a bulbar urethral stricture presents today for follow-up from the emergency department.   The patient has been cathing to keep the stricture open. Initially we recommended cathing daily, and then every other day. The patient has been doing it every other week. Yesterday in the shower he was performing catheterization and developed a sharp pain followed by some dripping blood per urethra. He got anxious and went to the emergency department. There he was noted to have a 200 cc PVR. He is complaining mostly of dysuria. The bleeding is stopped, the dysuria is improving. He has been taking antibiotics for a sinus infection. Denies any fevers.   Prior to this incident the patient was voiding relatively well. He was peeing every 3-4 hours, and only getting up once per night. Stream is relatively strong. He is still interested in more definitive treatment for stricture including urethroplasty.     ALLERGIES: Ceftin    MEDICATIONS: Phenazopyridine Hcl 200 mg tablet 1 tablet PO TID PRN  Sildenafil Citrate 20 mg tablet 1 tablet PO Daily PRN  Amlodipine Besylate 5 mg tablet  Bupropion Xl 450 mg tablet, extended release 24 hr  Olmesartan  Prilosec Otc 20 mg tablet, delayed release  Venlafaxine Hcl 75 mg tablet     GU PSH: Catheterize For Residual - 2021 Complex Uroflow - 2021 Cysto Dilate Stricture (M or F) - 2020 Cystoscopy - 2020 Injection For Bladder X-ray - 03/20/2020     NON-GU PSH: Deviated Septum Surgery Hand/finger Surgery     GU PMH: Anterior urethral stricture - 07/31/2020, - 2020 Bulbar urethral stricture - 07/06/2020, - 03/20/2020, - 2020 Urethral Stricture, Unspec - 2021 Gross hematuria - 2020, - 2020 Nocturnal Enuresis - 2020 Pelvic/perineal pain - 2020 Urinary Frequency - 2020 BPH w/LUTS - 2019 Weak Urinary Stream - 2019    NON-GU PMH: Psychogenic ED - 2021 Muscle weakness (generalized) - 2020 Other muscle spasm -  2020 Anxiety GERD Hypercholesterolemia Hypertension    FAMILY HISTORY: Heart Disease - Grandfather High Blood Pressure - Father High Cholesterol - Mother, Father, Brother Kidney Stones - Grandfather Thyroid Cancer - Father   SOCIAL HISTORY: Marital Status: Married Preferred Language: English; Ethnicity: Not Hispanic Or Latino; Race: White Current Smoking Status: Patient does not smoke anymore.   Tobacco Use Assessment Completed: Used Tobacco in last 30 days? Does drink.  Drinks 2 caffeinated drinks per day.    REVIEW OF SYSTEMS:    GU Review Male:   Patient denies frequent urination, hard to postpone urination, burning/ pain with urination, get up at night to urinate, leakage of urine, stream starts and stops, trouble starting your stream, have to strain to urinate , erection problems, and penile pain.  Gastrointestinal (Upper):   Patient denies nausea, vomiting, and indigestion/ heartburn.  Gastrointestinal (Lower):   Patient denies diarrhea and constipation.  Constitutional:   Patient denies fever, night sweats, weight loss, and fatigue.  Skin:   Patient denies skin rash/ lesion and itching.  Eyes:   Patient denies blurred vision and double vision.  Ears/ Nose/ Throat:   Patient denies sore throat and sinus problems.  Hematologic/Lymphatic:   Patient denies swollen glands and easy bruising.  Cardiovascular:   Patient denies chest pains and leg swelling.  Respiratory:   Patient denies cough and shortness of breath.  Endocrine:   Patient denies excessive thirst.  Musculoskeletal:   Patient denies back pain and joint pain.  Neurological:   Patient denies headaches and dizziness.  Psychologic:  Patient denies depression and anxiety.   VITAL SIGNS:      06/21/2022 12:59 PM  Weight 72.5 lb / 32.89 kg  Height 240 in / 609.6 cm  BP 136/81 mmHg  Pulse 78 /min  Temperature 98.0 F / 36.6 C  BMI 0.9 kg/m   MULTI-SYSTEM PHYSICAL EXAMINATION:    Respiratory: Normal breath  sounds. No labored breathing, no use of accessory muscles.   Cardiovascular: Regular rate and rhythm. No murmur, no gallop. Normal temperature, normal extremity pulses, no swelling, no varicosities.      Complexity of Data:  Source Of History:  Patient  Lab Test Review:   PSA  Records Review:   Previous Doctor Records, Previous Patient Records, POC Tool  Urine Test Review:   Urinalysis   PROCEDURES:          Urinalysis w/Scope - 81001 Dipstick Dipstick Cont'd Micro  Color: Yellow Bilirubin: Neg WBC/hpf: NS (Not Seen)  Appearance: Clear Ketones: Neg RBC/hpf: 3 - 10/hpf  Specific Gravity: 1.010 Blood: 3+ Bacteria: Rare (0-9/hpf)  pH: 6.0 Protein: Neg Cystals: NS (Not Seen)  Glucose: Neg Urobilinogen: 0.2 Casts: NS (Not Seen)    Nitrites: Neg Trichomonas: Not Present    Leukocyte Esterase: Neg Mucous: Not Present      Epithelial Cells: NS (Not Seen)      Yeast: NS (Not Seen)      Sperm: Not Present    Notes:      ASSESSMENT:      ICD-10 Details  1 GU:   Bulbar urethral stricture - N35.011    PLAN:            Medications Stop Meds: Ultram 50 mg tablet 1-2 tablet PO Q 6 H  Start: 10/13/2018  Discontinue: 06/21/2022  - Reason: finished           Orders Labs Urine Culture          Document Letter(s):  Created for Patient: Clinical Summary         Notes:   The patient falls past his prostate well performing CIC. Things seem to be settling down, and at this point nearly back to normal.   We discussed management strategies, I told the patient about the new Optilume balloon which he would like to try. At that time we can complete the hematuria evaluation and sure everything else is okay.   I discussed the surgery with him in detail. Discussed the appropriate expectations. Understands he will have a catheter for 24 hours after surgery. Following surgery, I do not think he needs to cath at least in the short-term.

## 2022-07-18 NOTE — Transfer of Care (Signed)
Immediate Anesthesia Transfer of Care Note  Patient: Henry Houston  Procedure(s) Performed: CYSTOSCOPY WITH RETROGRADE URETHROGRAM, URETHRAL BALLOON DILATION  Patient Location: PACU  Anesthesia Type:General  Level of Consciousness: awake, alert , and oriented  Airway & Oxygen Therapy: Patient Spontanous Breathing and Patient connected to face mask oxygen  Post-op Assessment: Report given to RN, Post -op Vital signs reviewed and stable, and Patient moving all extremities X 4  Post vital signs: Reviewed and stable  Last Vitals:  Vitals Value Taken Time  BP 148/80   Temp    Pulse 93   Resp 12   SpO2 100     Last Pain:  Vitals:   07/18/22 0808  TempSrc:   PainSc: 0-No pain      Patients Stated Pain Goal: 3 (63/89/37 3428)  Complications: No notable events documented.

## 2022-07-18 NOTE — Anesthesia Postprocedure Evaluation (Signed)
Anesthesia Post Note  Patient: Henry Houston  Procedure(s) Performed: CYSTOSCOPY WITH RETROGRADE URETHROGRAM, URETHRAL BALLOON DILATION     Patient location during evaluation: PACU Anesthesia Type: General Level of consciousness: awake and alert Pain management: pain level controlled Vital Signs Assessment: post-procedure vital signs reviewed and stable Respiratory status: spontaneous breathing, nonlabored ventilation, respiratory function stable and patient connected to nasal cannula oxygen Cardiovascular status: blood pressure returned to baseline and stable Postop Assessment: no apparent nausea or vomiting Anesthetic complications: no  No notable events documented.  Last Vitals:  Vitals:   07/18/22 1030 07/18/22 1045  BP: (!) 143/84 (!) 145/77  Pulse: 81 78  Resp: 16 16  Temp: 37.1 C   SpO2: 94% 98%    Last Pain:  Vitals:   07/18/22 1030  TempSrc:   PainSc: 0-No pain                 Barnet Glasgow

## 2022-07-19 ENCOUNTER — Encounter (HOSPITAL_COMMUNITY): Payer: Self-pay | Admitting: Urology

## 2022-07-22 ENCOUNTER — Ambulatory Visit (INDEPENDENT_AMBULATORY_CARE_PROVIDER_SITE_OTHER): Payer: BC Managed Care – PPO | Admitting: Physician Assistant

## 2022-07-22 ENCOUNTER — Encounter: Payer: Self-pay | Admitting: Physician Assistant

## 2022-07-22 VITALS — BP 140/90 | HR 73 | Temp 97.5°F | Ht 72.0 in | Wt 255.0 lb

## 2022-07-22 DIAGNOSIS — H9203 Otalgia, bilateral: Secondary | ICD-10-CM | POA: Diagnosis not present

## 2022-07-22 MED ORDER — AMOXICILLIN-POT CLAVULANATE 875-125 MG PO TABS: 1.0000 | ORAL_TABLET | Freq: Two times a day (BID) | ORAL | 0 refills | Status: DC

## 2022-07-22 MED ORDER — IPRATROPIUM BROMIDE 0.03 % NA SOLN: 2.0000 | Freq: Two times a day (BID) | NASAL | 1 refills | Status: DC

## 2022-07-22 NOTE — Progress Notes (Signed)
Henry Houston is a 52 y.o. male here for a follow up of a pre-existing problem.  History of Present Illness:   Chief Complaint  Patient presents with   Ear Pain    Pt c/o left ear pain started last week Tuesday, Pt was prescribed Z-Pak by Urology last week after procedure due to c/o cold and ear symptoms. Has not helped.    HPI  Ear pain Started last week on Tuesday Getting worse with time Left ear >> Right ear Doing saline nasal rinses with little relief Was given z-pack by urology and has completed this without relief Slight cough in AM Denies recent sick contacts  Denies: fevers, chills, n/v/d, chest pain  Past Medical History:  Diagnosis Date   Anxiety and depression    GERD (gastroesophageal reflux disease)    Hyperlipidemia    Hypertension    Obesity    Pneumonia      Social History   Tobacco Use   Smoking status: Former    Types: Cigarettes   Smokeless tobacco: Never   Tobacco comments:    smokes  one pack per week-mainly smokes with social drinking  Vaping Use   Vaping Use: Some days  Substance Use Topics   Alcohol use: Yes    Comment: weekends   Drug use: Never    Past Surgical History:  Procedure Laterality Date   BALLOON DILATION N/A 10/13/2018   Procedure: BALLOON DILATION;  Surgeon: [name], MD;  Location: [facility];  Service: Urology;  Laterality: N/A;   CYSTOSCOPY W/ URETERAL STENT PLACEMENT Bilateral 10/13/2018   Procedure: CYSTOSCOPY WITH RETROGRADE PYELOGRAM/URETERAL STENT PLACEMENT;  Surgeon: [name], MD;  Location: [facility];  Service: Urology;  Laterality: Bilateral;   CYSTOSCOPY WITH RETROGRADE URETHROGRAM N/A 07/18/2022   Procedure: CYSTOSCOPY WITH RETROGRADE URETHROGRAM, URETHRAL BALLOON DILATION;  Surgeon: [name], MD;  Location: [facility];  Service: Urology;  Laterality: N/A;  84 MINUTES NEEDED FOR CASE   HAND SURGERY     tendon surgery right hand as teenager after [place]     Family History  Problem Relation Age of Onset   Hypertension Father        since 1s   Hyperlipidemia Father    Thyroid cancer Father    Obesity Father        gastric bypass and now off meds but thyroid med   Coronary artery disease Other        grandparents in their 84's   Diabetes Other        grandparent   Hyperlipidemia Mother    Hypertension Mother    Healthy Brother     Allergies  Allergen Reactions   Levaquin  [Levofloxacin In D5w]     Other reaction(s): Abdominal Pain    Current Medications:   Current Outpatient Medications:    amLODipine (NORVASC) 5 MG tablet, TAKE 1 TABLET BY MOUTH EVERY DAY, Disp: 30 tablet, Rfl: 11   atorvastatin (LIPITOR) 20 MG tablet, Take 1 tablet (20 mg total) by mouth daily., Disp: 90 tablet, Rfl: 3   azithromycin (ZITHROMAX) 250 MG tablet, Take 250 mg by mouth daily., Disp: , Rfl:    buPROPion (WELLBUTRIN XL) 300 MG 24 hr tablet, Take 300 mg by mouth daily., Disp: , Rfl:    Cholecalciferol (VITAMIN D3 PO), Take 1 tablet by mouth daily., Disp: , Rfl:    Cyanocobalamin (B-12 PO), Take 1 tablet by mouth daily., Disp: , Rfl:  meloxicam (MOBIC) 15 MG tablet, Take 1 tablet (15 mg total) by mouth daily., Disp: 30 tablet, Rfl: 0   olmesartan (BENICAR) 40 MG tablet, TAKE 1 TABLET BY MOUTH EVERY DAY, Disp: 90 tablet, Rfl: 3   omeprazole (PRILOSEC) 20 MG capsule, Take 20 mg by mouth daily., Disp: , Rfl:    tadalafil (CIALIS) 20 MG tablet, Take 1 tablet (20 mg total) by mouth every other day as needed for erectile dysfunction., Disp: 6 tablet, Rfl: 11   tamsulosin (FLOMAX) 0.4 MG CAPS capsule, Take 1 capsule (0.4 mg total) by mouth daily., Disp: 21 capsule, Rfl: 0   venlafaxine XR (EFFEXOR-XR) 75 MG 24 hr capsule, Take 75 mg by mouth daily. , Disp: , Rfl: 3   zolpidem (AMBIEN CR) 12.5 MG CR tablet, Take 12.5 mg by mouth at bedtime., Disp: , Rfl: 3   Review of Systems:   ROS Negative unless otherwise specified per HPI.  Vitals:   Vitals:    07/22/22 1505  BP: (!) 140/90  Pulse: 73  Temp: (!) 97.5 F (36.4 C)  TempSrc: Temporal  SpO2: 99%  Weight: 255 lb (115.7 kg)  Height: 6' (1.829 m)     Body mass index is 34.58 kg/m.  Physical Exam:   Physical Exam Vitals and nursing note reviewed.  Constitutional:      General: He is not in acute distress.    Appearance: He is well-developed. He is not ill-appearing or toxic-appearing.  HENT:     Head: Normocephalic and atraumatic.     Right Ear: Ear canal and external ear normal. A middle ear effusion is present. Tympanic membrane is erythematous. Tympanic membrane is not retracted or bulging.     Left Ear: Ear canal and external ear normal. A middle ear effusion is present. Tympanic membrane is erythematous and bulging. Tympanic membrane is not retracted.     Nose: Nose normal.     Right Sinus: No maxillary sinus tenderness or frontal sinus tenderness.     Left Sinus: No maxillary sinus tenderness or frontal sinus tenderness.     Mouth/Throat:     Pharynx: Uvula midline. No posterior oropharyngeal erythema.  Eyes:     General: Lids are normal.     Conjunctiva/sclera: Conjunctivae normal.  Neck:     Trachea: Trachea normal.  Cardiovascular:     Rate and Rhythm: Normal rate and regular rhythm.     Heart sounds: Normal heart sounds, S1 normal and S2 normal.  Pulmonary:     Effort: Pulmonary effort is normal.     Breath sounds: Normal breath sounds. No decreased breath sounds, wheezing, rhonchi or rales.  Lymphadenopathy:     Cervical: No cervical adenopathy.  Skin:    General: Skin is warm and dry.  Neurological:     Mental Status: He is alert.  Psychiatric:        Speech: Speech normal.        Behavior: Behavior normal. Behavior is cooperative.     Assessment and Plan:   Otalgia of both ears AOM, R>L Start augmentin and atrovent nasal spray Push fluids and rest Follow-up if new/worsening or lack of improvement   Inda Coke, PA-C

## 2022-07-22 NOTE — Patient Instructions (Signed)
It was great to see you!  You have a double ear infection Start augmentin antibiotic and medicated nasal spray that I sent in  Push fluids and get plenty of rest. Please return if you are not improving as expected, or if you have high fevers (>101.5) or difficulty swallowing or worsening productive cough.  Call clinic with questions.  I hope you start feeling better soon!

## 2022-07-31 ENCOUNTER — Other Ambulatory Visit: Payer: Self-pay | Admitting: Family Medicine

## 2022-08-01 DIAGNOSIS — N35011 Post-traumatic bulbous urethral stricture: Secondary | ICD-10-CM | POA: Diagnosis not present

## 2022-08-05 ENCOUNTER — Encounter: Payer: Self-pay | Admitting: Family Medicine

## 2022-08-05 ENCOUNTER — Ambulatory Visit (INDEPENDENT_AMBULATORY_CARE_PROVIDER_SITE_OTHER): Payer: BC Managed Care – PPO | Admitting: Family Medicine

## 2022-08-05 VITALS — BP 118/70 | HR 79 | Temp 98.0°F | Ht 72.0 in | Wt 250.8 lb

## 2022-08-05 DIAGNOSIS — E785 Hyperlipidemia, unspecified: Secondary | ICD-10-CM

## 2022-08-05 DIAGNOSIS — Z125 Encounter for screening for malignant neoplasm of prostate: Secondary | ICD-10-CM

## 2022-08-05 DIAGNOSIS — I10 Essential (primary) hypertension: Secondary | ICD-10-CM | POA: Diagnosis not present

## 2022-08-05 NOTE — Patient Instructions (Addendum)
Schedule a lab visit at the check out desk within 2 weeks. Return for future fasting labs meaning nothing but water after midnight please. Ok to take your medications with water.    Glad the procedure went well  I love your goal of re engaging with tennis- 4 days a week  Recommended follow up: Return in about 6 months (around 02/03/2023) for physical or sooner if needed.Schedule b4 you leave.

## 2022-08-05 NOTE — Progress Notes (Signed)
Phone [phone] In person visit   Subjective:   Henry Houston is a 52 y.o. 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    Medications- reviewed and updated Current Outpatient Medications  Medication Sig Dispense Refill   amLODipine (NORVASC) 5 MG tablet TAKE 1 TABLET BY MOUTH EVERY DAY 30 tablet 11   atorvastatin (LIPITOR) 20 MG tablet TAKE 1 TABLET BY MOUTH EVERY DAY 90 tablet 3   buPROPion (WELLBUTRIN XL) 300 MG 24 hr tablet Take 300 mg by mouth daily.     Cholecalciferol (VITAMIN D3 PO) Take 1 tablet by mouth daily.     Cyanocobalamin (B-12 PO) Take 1 tablet by mouth daily.     ipratropium (ATROVENT) 0.03 % nasal spray Place 2 sprays into both nostrils every 12 (twelve) hours. 30 mL 1   meloxicam (MOBIC) 15 MG tablet Take 1 tablet (15 mg total) by mouth daily. 30 tablet 0   olmesartan (BENICAR) 40 MG tablet TAKE 1 TABLET BY MOUTH EVERY DAY 90 tablet 3   omeprazole (PRILOSEC) 20 MG capsule Take 20 mg by mouth daily.     tadalafil (CIALIS) 20 MG tablet Take 1 tablet (20 mg total) by mouth every other day as needed for erectile dysfunction. 6 tablet 11   tamsulosin (FLOMAX) 0.4 MG CAPS capsule Take 1 capsule (0.4 mg total) by mouth daily. 21 capsule 0   venlafaxine XR (EFFEXOR-XR) 75 MG 24 hr  capsule Take 75 mg by mouth daily.   3   zolpidem (AMBIEN CR) 12.5 MG CR tablet Take 12.5 mg by mouth at bedtime.  3   No current facility-administered medications for this visit.     Objective:  BP 118/70   Pulse 79   Temp 98 F (36.7 C)   Ht 6' (1.829 m)   Wt 250 lb 12.8 oz (113.8 kg)   SpO2 97%   BMI 34.01 kg/m  Gen: NAD, resting comfortably CV: RRR no murmurs rubs or gallops Lungs: CTAB no crackles, wheeze, rhonchi Ext: no edema Skin: warm, dry     Assessment and Plan   #social update- dating woman in [place]- together well over 6 months - would like to lose some weight and be more active with tennis before next visit  #hypertension S: compliant with amlodipine 5 mg--> 2.5 mg discussed but taking full, olmesartan 40 mg BP Readings from Last 3 Encounters:  08/05/22 118/70  07/22/22 (!) 140/90  07/18/22 124/84  A/P: stable- continue current medicines  - occasionally feels lightheaded- in those situations can push fluids and take half tablet of amlodipine. Sounds like may have bene related to tadalafil and tamsulosin combo- he wil be cautious. The tamsulosin was only  3 weeks- almost done.   #hyperlipidemia/obesity S: compliant with atorvastatin 20 mg. We have considered increasing to atorvastatin or rosuvastatin if LDL numbers remain elevated despite lifestyle changes Lab Results  Component Value Date   CHOL 197 07/31/2021   HDL 59.10 07/31/2021   LDLCALC 120 (H) 07/31/2021   LDLDIRECT 76.0 02/13/2022   TRIG 91.0 07/31/2021   CHOLHDL 3 07/31/2021   A/P: For hyperlipidemia- update when he comes back in a few days For obesity-Encouraged need for healthy eating, regular exercise, weight loss. Plans to get going with tennis again- very helpful for him in past   % Generalized anxiety disorder-patient is followed by psychiatry Dr. Casimiro Needle. He is on combination of venlafaxine 75 mg extended release, Wellbutrin 300 mg extended release and Ambien for sleep   #Urethral  stricture- balloon procedure with Dr. Louis Meckel recently February 2024 -prior once a week self caths -also had cystoscopy which was reassuring -was told could last 6-7 years  #prediabetes- 114 fasting cbg in past- recheck next physical Lab Results  Component Value Date   HGBA1C 5.5 02/13/2022   HGBA1C 5.5 12/10/2007   HGBA1C 5.1 10/15/2006   Recommended follow up: Return in about 6 months (around 02/03/2023) for physical or sooner if needed.Schedule b4 you leave.  Lab/Order associations: will come back fasting   ICD-10-CM   1. Essential hypertension  I10     2. Hyperlipidemia, unspecified hyperlipidemia type  E78.5     3. Screening for prostate cancer  Z12.5      No orders of the defined types were placed in this encounter.  Return precautions advised.  Garret Reddish, MD

## 2022-09-29 ENCOUNTER — Other Ambulatory Visit: Payer: Self-pay | Admitting: Physician Assistant

## 2022-10-22 ENCOUNTER — Other Ambulatory Visit: Payer: Self-pay | Admitting: Family Medicine

## 2022-10-22 ENCOUNTER — Other Ambulatory Visit: Payer: Self-pay

## 2022-10-22 DIAGNOSIS — F331 Major depressive disorder, recurrent, moderate: Secondary | ICD-10-CM | POA: Diagnosis not present

## 2022-10-22 MED ORDER — IPRATROPIUM BROMIDE 0.03 % NA SOLN: NASAL | 5 refills | Status: DC

## 2022-10-26 ENCOUNTER — Other Ambulatory Visit: Payer: Self-pay | Admitting: Family Medicine

## 2023-01-02 DIAGNOSIS — R0982 Postnasal drip: Secondary | ICD-10-CM | POA: Diagnosis not present

## 2023-01-02 DIAGNOSIS — R0981 Nasal congestion: Secondary | ICD-10-CM | POA: Diagnosis not present

## 2023-01-02 DIAGNOSIS — R197 Diarrhea, unspecified: Secondary | ICD-10-CM | POA: Diagnosis not present

## 2023-01-02 DIAGNOSIS — R059 Cough, unspecified: Secondary | ICD-10-CM | POA: Diagnosis not present

## 2023-01-08 ENCOUNTER — Encounter (INDEPENDENT_AMBULATORY_CARE_PROVIDER_SITE_OTHER): Payer: Self-pay

## 2023-01-08 ENCOUNTER — Encounter: Payer: Self-pay | Admitting: Family Medicine

## 2023-01-27 DIAGNOSIS — N35011 Post-traumatic bulbous urethral stricture: Secondary | ICD-10-CM | POA: Diagnosis not present

## 2023-01-29 ENCOUNTER — Other Ambulatory Visit: Payer: Self-pay | Admitting: Family Medicine

## 2023-01-31 ENCOUNTER — Other Ambulatory Visit: Payer: Self-pay

## 2023-01-31 ENCOUNTER — Other Ambulatory Visit (INDEPENDENT_AMBULATORY_CARE_PROVIDER_SITE_OTHER): Payer: BC Managed Care – PPO

## 2023-01-31 DIAGNOSIS — Z125 Encounter for screening for malignant neoplasm of prostate: Secondary | ICD-10-CM | POA: Diagnosis not present

## 2023-01-31 DIAGNOSIS — E785 Hyperlipidemia, unspecified: Secondary | ICD-10-CM

## 2023-01-31 DIAGNOSIS — I10 Essential (primary) hypertension: Secondary | ICD-10-CM

## 2023-01-31 DIAGNOSIS — R739 Hyperglycemia, unspecified: Secondary | ICD-10-CM | POA: Diagnosis not present

## 2023-01-31 DIAGNOSIS — Z0000 Encounter for general adult medical examination without abnormal findings: Secondary | ICD-10-CM | POA: Diagnosis not present

## 2023-01-31 LAB — CBC WITH DIFFERENTIAL/PLATELET
Basophils Absolute: 0 10*3/uL (ref 0.0–0.1)
Basophils Relative: 0.6 % (ref 0.0–3.0)
Eosinophils Absolute: 0.1 10*3/uL (ref 0.0–0.7)
Eosinophils Relative: 2.4 % (ref 0.0–5.0)
HCT: 39 % (ref 39.0–52.0)
Hemoglobin: 13.2 g/dL (ref 13.0–17.0)
Lymphocytes Relative: 33.1 % (ref 12.0–46.0)
Lymphs Abs: 1.5 10*3/uL (ref 0.7–4.0)
MCHC: 33.8 g/dL (ref 30.0–36.0)
MCV: 91.8 fl (ref 78.0–100.0)
Monocytes Absolute: 0.6 10*3/uL (ref 0.1–1.0)
Monocytes Relative: 12.5 % — ABNORMAL HIGH (ref 3.0–12.0)
Neutro Abs: 2.3 10*3/uL (ref 1.4–7.7)
Neutrophils Relative %: 51.4 % (ref 43.0–77.0)
Platelets: 265 10*3/uL (ref 150.0–400.0)
RBC: 4.25 Mil/uL (ref 4.22–5.81)
RDW: 13.8 % (ref 11.5–15.5)
WBC: 4.5 10*3/uL (ref 4.0–10.5)

## 2023-01-31 LAB — COMPREHENSIVE METABOLIC PANEL
ALT: 18 U/L (ref 0–53)
AST: 15 U/L (ref 0–37)
Albumin: 4 g/dL (ref 3.5–5.2)
Alkaline Phosphatase: 61 U/L (ref 39–117)
BUN: 15 mg/dL (ref 6–23)
CO2: 28 mEq/L (ref 19–32)
Calcium: 9.1 mg/dL (ref 8.4–10.5)
Chloride: 106 mEq/L (ref 96–112)
Creatinine, Ser: 1.07 mg/dL (ref 0.40–1.50)
GFR: 81.04 mL/min (ref >60.00)
Glucose, Bld: 106 mg/dL — ABNORMAL HIGH (ref 70–99)
Potassium: 4.4 mEq/L (ref 3.5–5.1)
Sodium: 140 mEq/L (ref 135–145)
Total Bilirubin: 0.5 mg/dL (ref 0.2–1.2)
Total Protein: 6.6 g/dL (ref 6.0–8.3)

## 2023-01-31 LAB — LIPID PANEL
Cholesterol: 189 mg/dL (ref 0–200)
HDL: 44.3 mg/dL (ref >39.00)
LDL Cholesterol: 131 mg/dL — ABNORMAL HIGH (ref 0–99)
NonHDL: 145.18
Total CHOL/HDL Ratio: 4
Triglycerides: 70 mg/dL (ref 0.0–149.0)
VLDL: 14 mg/dL (ref 0.0–40.0)

## 2023-01-31 LAB — HEMOGLOBIN A1C: Hgb A1c MFr Bld: 5.4 % (ref 4.6–6.5)

## 2023-01-31 LAB — PSA: PSA: 0.74 ng/mL (ref 0.10–4.00)

## 2023-02-03 ENCOUNTER — Encounter: Payer: Self-pay | Admitting: Family Medicine

## 2023-02-03 ENCOUNTER — Ambulatory Visit (INDEPENDENT_AMBULATORY_CARE_PROVIDER_SITE_OTHER): Payer: BC Managed Care – PPO | Admitting: Family Medicine

## 2023-02-03 VITALS — BP 130/80 | HR 72 | Temp 98.0°F | Ht 72.0 in | Wt 247.0 lb

## 2023-02-03 DIAGNOSIS — Z0000 Encounter for general adult medical examination without abnormal findings: Secondary | ICD-10-CM | POA: Diagnosis not present

## 2023-02-03 DIAGNOSIS — E785 Hyperlipidemia, unspecified: Secondary | ICD-10-CM

## 2023-02-03 DIAGNOSIS — I10 Essential (primary) hypertension: Secondary | ICD-10-CM

## 2023-02-03 DIAGNOSIS — Z23 Encounter for immunization: Secondary | ICD-10-CM

## 2023-02-03 MED ORDER — OLMESARTAN MEDOXOMIL 40 MG PO TABS: 40.0000 mg | ORAL_TABLET | Freq: Every day | ORAL | 3 refills | Status: DC

## 2023-02-03 MED ORDER — AMLODIPINE BESYLATE 5 MG PO TABS: ORAL_TABLET | ORAL | 3 refills | Status: AC
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
## 2023-02-03 NOTE — Patient Instructions (Addendum)
Let us know when you are ready to get your [facility] vaccine or if you get it at a pharmacy  Labs look pretty good other than restarting statin.   I like how motivated you are to make changes- Iove the exercise and I think a retrial of calorie counting makes sense. Even dropping 5 lbs by follow up would be great.   Recommended follow up: Return in about 6 months (around 08/06/2023) for followup or sooner if needed.Schedule b4 you leave.

## 2023-02-13 DIAGNOSIS — M25551 Pain in right hip: Secondary | ICD-10-CM | POA: Diagnosis not present

## 2023-02-13 DIAGNOSIS — R29898 Other symptoms and signs involving the musculoskeletal system: Secondary | ICD-10-CM | POA: Diagnosis not present

## 2023-02-13 DIAGNOSIS — M25651 Stiffness of right hip, not elsewhere classified: Secondary | ICD-10-CM | POA: Diagnosis not present

## 2023-02-13 DIAGNOSIS — R262 Difficulty in walking, not elsewhere classified: Secondary | ICD-10-CM | POA: Diagnosis not present

## 2023-02-19 DIAGNOSIS — M25651 Stiffness of right hip, not elsewhere classified: Secondary | ICD-10-CM | POA: Diagnosis not present

## 2023-02-19 DIAGNOSIS — R29898 Other symptoms and signs involving the musculoskeletal system: Secondary | ICD-10-CM | POA: Diagnosis not present

## 2023-02-19 DIAGNOSIS — R262 Difficulty in walking, not elsewhere classified: Secondary | ICD-10-CM | POA: Diagnosis not present

## 2023-02-19 DIAGNOSIS — M25551 Pain in right hip: Secondary | ICD-10-CM | POA: Diagnosis not present

## 2023-02-20 DIAGNOSIS — R262 Difficulty in walking, not elsewhere classified: Secondary | ICD-10-CM | POA: Diagnosis not present

## 2023-02-20 DIAGNOSIS — M25651 Stiffness of right hip, not elsewhere classified: Secondary | ICD-10-CM | POA: Diagnosis not present

## 2023-02-20 DIAGNOSIS — R29898 Other symptoms and signs involving the musculoskeletal system: Secondary | ICD-10-CM | POA: Diagnosis not present

## 2023-02-20 DIAGNOSIS — M25551 Pain in right hip: Secondary | ICD-10-CM | POA: Diagnosis not present

## 2023-02-21 DIAGNOSIS — M25651 Stiffness of right hip, not elsewhere classified: Secondary | ICD-10-CM | POA: Diagnosis not present

## 2023-02-21 DIAGNOSIS — M25551 Pain in right hip: Secondary | ICD-10-CM | POA: Diagnosis not present

## 2023-02-21 DIAGNOSIS — R262 Difficulty in walking, not elsewhere classified: Secondary | ICD-10-CM | POA: Diagnosis not present

## 2023-02-21 DIAGNOSIS — R29898 Other symptoms and signs involving the musculoskeletal system: Secondary | ICD-10-CM | POA: Diagnosis not present

## 2023-02-26 DIAGNOSIS — M25651 Stiffness of right hip, not elsewhere classified: Secondary | ICD-10-CM | POA: Diagnosis not present

## 2023-02-26 DIAGNOSIS — R262 Difficulty in walking, not elsewhere classified: Secondary | ICD-10-CM | POA: Diagnosis not present

## 2023-02-26 DIAGNOSIS — M25551 Pain in right hip: Secondary | ICD-10-CM | POA: Diagnosis not present

## 2023-02-26 DIAGNOSIS — R29898 Other symptoms and signs involving the musculoskeletal system: Secondary | ICD-10-CM | POA: Diagnosis not present

## 2023-03-12 DIAGNOSIS — M25651 Stiffness of right hip, not elsewhere classified: Secondary | ICD-10-CM | POA: Diagnosis not present

## 2023-03-12 DIAGNOSIS — R29898 Other symptoms and signs involving the musculoskeletal system: Secondary | ICD-10-CM | POA: Diagnosis not present

## 2023-03-12 DIAGNOSIS — R262 Difficulty in walking, not elsewhere classified: Secondary | ICD-10-CM | POA: Diagnosis not present

## 2023-03-12 DIAGNOSIS — M25551 Pain in right hip: Secondary | ICD-10-CM | POA: Diagnosis not present

## 2023-03-21 DIAGNOSIS — M25651 Stiffness of right hip, not elsewhere classified: Secondary | ICD-10-CM | POA: Diagnosis not present

## 2023-03-21 DIAGNOSIS — R262 Difficulty in walking, not elsewhere classified: Secondary | ICD-10-CM | POA: Diagnosis not present

## 2023-03-21 DIAGNOSIS — M25551 Pain in right hip: Secondary | ICD-10-CM | POA: Diagnosis not present

## 2023-03-21 DIAGNOSIS — R29898 Other symptoms and signs involving the musculoskeletal system: Secondary | ICD-10-CM | POA: Diagnosis not present

## 2023-06-17 DIAGNOSIS — B9689 Other specified bacterial agents as the cause of diseases classified elsewhere: Secondary | ICD-10-CM | POA: Diagnosis not present

## 2023-06-17 DIAGNOSIS — J019 Acute sinusitis, unspecified: Secondary | ICD-10-CM | POA: Diagnosis not present

## 2023-07-14 DIAGNOSIS — F331 Major depressive disorder, recurrent, moderate: Secondary | ICD-10-CM | POA: Diagnosis not present

## 2023-07-15 NOTE — Progress Notes (Signed)
 This encounter was created in error - please disregard.

## 2023-08-07 ENCOUNTER — Ambulatory Visit: Payer: BC Managed Care – PPO | Admitting: Family Medicine

## 2023-08-22 ENCOUNTER — Ambulatory Visit (INDEPENDENT_AMBULATORY_CARE_PROVIDER_SITE_OTHER): Payer: BC Managed Care – PPO | Admitting: Family Medicine

## 2023-08-22 ENCOUNTER — Encounter: Payer: Self-pay | Admitting: Family Medicine

## 2023-08-22 VITALS — BP 108/60 | HR 82 | Temp 97.5°F | Ht 72.0 in | Wt 256.0 lb

## 2023-08-22 DIAGNOSIS — E785 Hyperlipidemia, unspecified: Secondary | ICD-10-CM | POA: Diagnosis not present

## 2023-08-22 DIAGNOSIS — E538 Deficiency of other specified B group vitamins: Secondary | ICD-10-CM

## 2023-08-22 DIAGNOSIS — E669 Obesity, unspecified: Secondary | ICD-10-CM | POA: Diagnosis not present

## 2023-08-22 DIAGNOSIS — I10 Essential (primary) hypertension: Secondary | ICD-10-CM

## 2023-08-22 DIAGNOSIS — Z131 Encounter for screening for diabetes mellitus: Secondary | ICD-10-CM | POA: Diagnosis not present

## 2023-08-22 NOTE — Progress Notes (Signed)
 Phone [phone] In person visit   Subjective:   Henry Houston is a 53 y.o. year old very pleasant male patient who presents for/with See problem oriented charting Chief Complaint  Patient presents with   Medical Management of Chronic Issues   Hypertension   Past Medical History-  Patient Active Problem List   Diagnosis Date Noted   Urethral stricture in male 10/13/2018    Priority: High   Ex-smoker for less than 1 year 03/12/2016    Priority: High   GAD (generalized anxiety disorder) 05/26/2007    Priority: Medium    Hyperlipidemia 04/16/2007    Priority: Medium    Essential hypertension 04/16/2007    Priority: Medium    Obesity (BMI 30.0-34.9) 04/16/2007    Priority: Low   DEVIATED SEPTUM 04/16/2007    Priority: Low   GERD 04/16/2007    Priority: Low   Low back pain 07/26/2011    Priority: 1.    Medications- reviewed and updated Current Outpatient Medications  Medication Sig Dispense Refill   amLODipine (NORVASC) 5 MG tablet TAKE 1 TABLET BY MOUTH EVERY DAY 90 tablet 3   atorvastatin (LIPITOR) 20 MG tablet TAKE 1 TABLET BY MOUTH EVERY DAY 90 tablet 3   buPROPion (WELLBUTRIN XL) 300 MG 24 hr tablet Take 300 mg by mouth daily.     Cholecalciferol (VITAMIN D3 PO) Take 1 tablet by mouth daily.     Cyanocobalamin (B-12 PO) Take 1 tablet by mouth daily.     ipratropium (ATROVENT) 0.03 % nasal spray PLACE 2 SPRAYS INTO BOTH NOSTRILS EVERY 12 (TWELVE) HOURS. 90 mL 3   olmesartan (BENICAR) 40 MG tablet Take 1 tablet (40 mg total) by mouth daily. 90 tablet 3   omeprazole (PRILOSEC) 20 MG capsule Take 20 mg by mouth daily.     tadalafil (CIALIS) 20 MG tablet Take 1 tablet (20 mg total) by mouth every other day as needed for erectile dysfunction. 6 tablet 11   venlafaxine XR (EFFEXOR-XR) 75 MG 24 hr capsule Take 75 mg by mouth daily.   3   zolpidem (AMBIEN CR) 12.5 MG CR tablet Take 12.5 mg by mouth at bedtime.  3   No current facility-administered medications for  this visit.     Objective:  BP 108/60   Pulse 82   Temp (!) 97.5 F (36.4 C)   Ht 6' (1.829 m)   Wt 256 lb (116.1 kg)   SpO2 98%   BMI 34.72 kg/m  Gen: NAD, resting comfortably CV: RRR no murmurs rubs or gallops Lungs: CTAB no crackles, wheeze, rhonchi Ext: no edema Skin: warm, dry     Assessment and Plan   #obesity- weight gain over holidays - stress with work increased appetite- less tennis as well- plans to reverse this- already got out this afternoon to play.  -feels can reduce portions.   Wt Readings from Last 3 Encounters:  08/22/23 256 lb (116.1 kg)  02/03/23 247 lb (112 kg)  08/05/22 250 lb 12.8 oz (113.8 kg)    #relative B12 deficiency- noted about 2 years ago- on 5000 mg every week- update B12 with labs  #hypertension S: compliant with amlodipine 5 mg, olmesartan 40 mg  BP Readings from Last 3 Encounters:  08/22/23 108/60  02/03/23 130/80  08/05/22 118/70  A/P: well controlled continue current medications   #hyperlipidemia S: compliant with atorvastatin 20 mg- more consistent lately.   A/P: For hyperlipidemia-with better consistency hoping for improved LDL- will come back for this  %  Generalized anxiety disorder-patient is followed by psychiatry Dr. Donell Beers. He is on combination of venlafaxine 75 mg extended release, Wellbutrin 300 mg extended release and Ambien for sleep- reasonably managed  -we are open to managing the medicine if needed  #Urethral stricture- balloon procedure with Dr. Marlou Porch recently February 2024 -hoping 10 years of benefit- had a procedure that lengthened from 6-7 years - not on tamsulosin anymore -once a week self caths  #prediabetes- 114 fasting capillary blood glucose and a little lower last time - check a1c and fasting sugar next week  Recommended follow up: Return in about 6 months (around 02/22/2024) for physical or sooner if needed.Schedule b4 you leave.  Lab/Order associations:   ICD-10-CM   1. Essential hypertension   I10 LDL cholesterol, direct    Comprehensive metabolic panel    2. Hyperlipidemia, unspecified hyperlipidemia type  E78.5 LDL cholesterol, direct    Comprehensive metabolic panel    3. Screening for diabetes mellitus  Z13.1 Hemoglobin A1c    4. Obesity (BMI 30-39.9)  E66.9 Hemoglobin A1c    5. B12 deficiency  E53.8 Vitamin B12     No orders of the defined types were placed in this encounter.  Return precautions advised.  Tana Conch, MD

## 2023-08-22 NOTE — Patient Instructions (Addendum)
 Schedule a lab visit at the check out desk within 2 weeks. Return for future fasting labs meaning nothing but water after midnight please. Ok to take your medications with water.   No changes today other than getting back to your tennis and dietary changes- consider YUKA app or myfitnesspal (calorie counting)  Recommended follow up: Return in about 6 months (around 02/22/2024) for physical or sooner if needed.Schedule b4 you leave.

## 2023-09-29 DIAGNOSIS — B9689 Other specified bacterial agents as the cause of diseases classified elsewhere: Secondary | ICD-10-CM | POA: Diagnosis not present

## 2023-09-29 DIAGNOSIS — J019 Acute sinusitis, unspecified: Secondary | ICD-10-CM | POA: Diagnosis not present

## 2023-10-13 DIAGNOSIS — M25551 Pain in right hip: Secondary | ICD-10-CM | POA: Diagnosis not present

## 2023-10-13 DIAGNOSIS — M25651 Stiffness of right hip, not elsewhere classified: Secondary | ICD-10-CM | POA: Diagnosis not present

## 2023-10-13 DIAGNOSIS — M25652 Stiffness of left hip, not elsewhere classified: Secondary | ICD-10-CM | POA: Diagnosis not present

## 2023-10-13 DIAGNOSIS — M25552 Pain in left hip: Secondary | ICD-10-CM | POA: Diagnosis not present

## 2023-10-27 DIAGNOSIS — M25552 Pain in left hip: Secondary | ICD-10-CM | POA: Diagnosis not present

## 2023-10-27 DIAGNOSIS — M25551 Pain in right hip: Secondary | ICD-10-CM | POA: Diagnosis not present

## 2023-10-27 DIAGNOSIS — M25652 Stiffness of left hip, not elsewhere classified: Secondary | ICD-10-CM | POA: Diagnosis not present

## 2023-10-27 DIAGNOSIS — M25651 Stiffness of right hip, not elsewhere classified: Secondary | ICD-10-CM | POA: Diagnosis not present

## 2023-11-06 DIAGNOSIS — M25651 Stiffness of right hip, not elsewhere classified: Secondary | ICD-10-CM | POA: Diagnosis not present

## 2023-11-06 DIAGNOSIS — M25652 Stiffness of left hip, not elsewhere classified: Secondary | ICD-10-CM | POA: Diagnosis not present

## 2023-11-06 DIAGNOSIS — M25551 Pain in right hip: Secondary | ICD-10-CM | POA: Diagnosis not present

## 2023-11-06 DIAGNOSIS — M25552 Pain in left hip: Secondary | ICD-10-CM | POA: Diagnosis not present

## 2023-11-13 DIAGNOSIS — M25551 Pain in right hip: Secondary | ICD-10-CM | POA: Diagnosis not present

## 2023-11-13 DIAGNOSIS — M25652 Stiffness of left hip, not elsewhere classified: Secondary | ICD-10-CM | POA: Diagnosis not present

## 2023-11-13 DIAGNOSIS — M25552 Pain in left hip: Secondary | ICD-10-CM | POA: Diagnosis not present

## 2023-11-13 DIAGNOSIS — M25651 Stiffness of right hip, not elsewhere classified: Secondary | ICD-10-CM | POA: Diagnosis not present

## 2023-11-27 DIAGNOSIS — M25551 Pain in right hip: Secondary | ICD-10-CM | POA: Diagnosis not present

## 2023-11-27 DIAGNOSIS — M25652 Stiffness of left hip, not elsewhere classified: Secondary | ICD-10-CM | POA: Diagnosis not present

## 2023-11-27 DIAGNOSIS — M25552 Pain in left hip: Secondary | ICD-10-CM | POA: Diagnosis not present

## 2023-11-27 DIAGNOSIS — M25651 Stiffness of right hip, not elsewhere classified: Secondary | ICD-10-CM | POA: Diagnosis not present

## 2023-12-09 DIAGNOSIS — F331 Major depressive disorder, recurrent, moderate: Secondary | ICD-10-CM | POA: Diagnosis not present

## 2024-02-24 ENCOUNTER — Ambulatory Visit (INDEPENDENT_AMBULATORY_CARE_PROVIDER_SITE_OTHER): Admitting: Family Medicine

## 2024-02-24 VITALS — BP 122/68 | HR 76 | Temp 98.2°F | Ht 72.0 in | Wt 257.8 lb

## 2024-02-24 DIAGNOSIS — I10 Essential (primary) hypertension: Secondary | ICD-10-CM

## 2024-02-24 DIAGNOSIS — Z131 Encounter for screening for diabetes mellitus: Secondary | ICD-10-CM | POA: Diagnosis not present

## 2024-02-24 DIAGNOSIS — Z0000 Encounter for general adult medical examination without abnormal findings: Secondary | ICD-10-CM | POA: Diagnosis not present

## 2024-02-24 DIAGNOSIS — E785 Hyperlipidemia, unspecified: Secondary | ICD-10-CM | POA: Diagnosis not present

## 2024-02-24 DIAGNOSIS — Z125 Encounter for screening for malignant neoplasm of prostate: Secondary | ICD-10-CM | POA: Diagnosis not present

## 2024-02-24 DIAGNOSIS — E669 Obesity, unspecified: Secondary | ICD-10-CM

## 2024-02-24 DIAGNOSIS — E538 Deficiency of other specified B group vitamins: Secondary | ICD-10-CM | POA: Diagnosis not present

## 2024-02-24 NOTE — Patient Instructions (Addendum)
 Encourage trial of myfitnesspal for calorie counting. Yuka app can be hepful as well Lets have visit in 6 months to see progress or 3 months and if not improving consider zebpound.   Due for Tetanus, Diphtheria, and Pertussis (Tdap) on 03/01/2024- can call for nurse visit or get at pharmacy  We will call you within two weeks about your referral to CT calcium  scoring  through [facility].  Their phone number is [phone].  Please call them if you have not heard in 1-2 weeks   Recommended follow up: Return in about 3 months (around 05/25/2024) for followup or sooner if needed.Schedule b4 you leave.
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
## 2024-02-25 LAB — CBC WITH DIFFERENTIAL/PLATELET
Basophils Absolute: 0.1 K/uL (ref 0.0–0.1)
Basophils Relative: 1.3 % (ref 0.0–3.0)
Eosinophils Absolute: 0.1 K/uL (ref 0.0–0.7)
Eosinophils Relative: 1.9 % (ref 0.0–5.0)
HCT: 42.1 % (ref 39.0–52.0)
Hemoglobin: 14.3 g/dL (ref 13.0–17.0)
Lymphocytes Relative: 33.8 % (ref 12.0–46.0)
Lymphs Abs: 1.8 K/uL (ref 0.7–4.0)
MCHC: 33.9 g/dL (ref 30.0–36.0)
MCV: 91.3 fl (ref 78.0–100.0)
Monocytes Absolute: 0.6 K/uL (ref 0.1–1.0)
Monocytes Relative: 11.8 % (ref 3.0–12.0)
Neutro Abs: 2.7 K/uL (ref 1.4–7.7)
Neutrophils Relative %: 51.2 % (ref 43.0–77.0)
Platelets: 279 K/uL (ref 150.0–400.0)
RBC: 4.61 Mil/uL (ref 4.22–5.81)
RDW: 13.6 % (ref 11.5–15.5)
WBC: 5.3 K/uL (ref 4.0–10.5)

## 2024-02-25 LAB — PSA: PSA: 0.81 ng/mL (ref 0.10–4.00)

## 2024-02-25 LAB — HEMOGLOBIN A1C: Hgb A1c MFr Bld: 6 % (ref 4.6–6.5)

## 2024-02-25 LAB — VITAMIN B12: Vitamin B-12: 470 pg/mL (ref 211–911)

## 2024-02-26 ENCOUNTER — Ambulatory Visit: Payer: Self-pay | Admitting: Family Medicine

## 2024-02-26 ENCOUNTER — Other Ambulatory Visit

## 2024-02-26 DIAGNOSIS — E785 Hyperlipidemia, unspecified: Secondary | ICD-10-CM

## 2024-02-27 ENCOUNTER — Ambulatory Visit: Payer: Self-pay | Admitting: Family Medicine

## 2024-02-27 ENCOUNTER — Other Ambulatory Visit (INDEPENDENT_AMBULATORY_CARE_PROVIDER_SITE_OTHER)

## 2024-02-27 DIAGNOSIS — E785 Hyperlipidemia, unspecified: Secondary | ICD-10-CM

## 2024-02-27 LAB — COMPREHENSIVE METABOLIC PANEL WITH GFR
ALT: 29 U/L (ref 0–53)
AST: 20 U/L (ref 0–37)
Albumin: 4.5 g/dL (ref 3.5–5.2)
Alkaline Phosphatase: 73 U/L (ref 39–117)
BUN: 15 mg/dL (ref 6–23)
CO2: 28 meq/L (ref 19–32)
Calcium: 9.7 mg/dL (ref 8.4–10.5)
Chloride: 100 meq/L (ref 96–112)
Creatinine, Ser: 1.03 mg/dL (ref 0.40–1.50)
GFR: 84.19 mL/min (ref >60.00)
Glucose, Bld: 101 mg/dL — ABNORMAL HIGH (ref 70–99)
Potassium: 4.4 meq/L (ref 3.5–5.1)
Sodium: 136 meq/L (ref 135–145)
Total Bilirubin: 0.4 mg/dL (ref 0.2–1.2)
Total Protein: 7.2 g/dL (ref 6.0–8.3)

## 2024-02-27 LAB — LIPID PANEL
Cholesterol: 179 mg/dL (ref 0–200)
HDL: 52.5 mg/dL (ref >39.00)
LDL Cholesterol: 108 mg/dL — ABNORMAL HIGH (ref 0–99)
NonHDL: 126.36
Total CHOL/HDL Ratio: 3
Triglycerides: 92 mg/dL (ref 0.0–149.0)
VLDL: 18.4 mg/dL (ref 0.0–40.0)

## 2024-03-05 ENCOUNTER — Ambulatory Visit
Admission: RE | Admit: 2024-03-05 | Discharge: 2024-03-05 | Disposition: A | Discharge status: Home / Self Care | Source: Ambulatory Visit | Attending: Family Medicine | Admitting: Family Medicine

## 2024-03-05 DIAGNOSIS — E785 Hyperlipidemia, unspecified: Secondary | ICD-10-CM

## 2024-03-09 ENCOUNTER — Telehealth: Payer: Self-pay | Admitting: Family Medicine

## 2024-03-09 ENCOUNTER — Other Ambulatory Visit: Payer: Self-pay | Admitting: Family Medicine

## 2024-03-09 DIAGNOSIS — R931 Abnormal findings on diagnostic imaging of heart and coronary circulation: Secondary | ICD-10-CM | POA: Insufficient documentation

## 2024-03-09 NOTE — Telephone Encounter (Signed)
 Referral needed?   Copied from CRM [id number]. Topic: General - Other >> Mar 09, 2024  1:25 PM Rosina BIRCH wrote: Reason for CRM: patient called stating he wanted to get a recommendation for a cardiologist because his calcium  score was high-[phone]

## 2024-04-22 ENCOUNTER — Encounter (HOSPITAL_BASED_OUTPATIENT_CLINIC_OR_DEPARTMENT_OTHER): Payer: Self-pay | Admitting: Nurse Practitioner

## 2024-04-22 ENCOUNTER — Ambulatory Visit (INDEPENDENT_AMBULATORY_CARE_PROVIDER_SITE_OTHER): Payer: Self-pay | Admitting: Nurse Practitioner

## 2024-04-22 VITALS — BP 120/84 | HR 68 | Ht 72.5 in | Wt 252.4 lb

## 2024-04-22 DIAGNOSIS — Z7189 Other specified counseling: Secondary | ICD-10-CM | POA: Diagnosis not present

## 2024-04-22 DIAGNOSIS — I2510 Atherosclerotic heart disease of native coronary artery without angina pectoris: Secondary | ICD-10-CM

## 2024-04-22 DIAGNOSIS — R931 Abnormal findings on diagnostic imaging of heart and coronary circulation: Secondary | ICD-10-CM | POA: Diagnosis not present

## 2024-04-22 DIAGNOSIS — E785 Hyperlipidemia, unspecified: Secondary | ICD-10-CM | POA: Diagnosis not present

## 2024-04-22 DIAGNOSIS — I10 Essential (primary) hypertension: Secondary | ICD-10-CM

## 2024-04-22 DIAGNOSIS — E66811 Obesity, class 1: Secondary | ICD-10-CM

## 2024-04-22 MED ORDER — METOPROLOL TARTRATE 50 MG PO TABS: 50.0000 mg | ORAL_TABLET | Freq: Once | ORAL | 0 refills | Status: AC

## 2024-04-22 NOTE — Progress Notes (Signed)
 Cardiology Office Note:  .   Date:  04/22/2024 ID:  Henry Houston, DOB 1970/11/20, MRN [id number] PCP: Henry Garnette KIDD, MD [facility] Providers Cardiologist:  None   Patient Profile: .      PMH Coronary artery disease CT calcium  score 03/05/24 CAC Score 133 (90th percentile) LAD 133 Hypertension Hyperlipidemia       History of Present Illness: .    Discussed the use of AI scribe software for clinical note transcription with the patient, who gave verbal consent to proceed.  History of Present Illness Henry Houston is a very pleasant 53 year old male who presents for evaluation of coronary artery disease after a calcium  score of 133. He was referred by his primary care physician, Dr. Katrinka. Calcification is in LAD alone, which is concerning to him. He has mildly elevated cholesterol and is on atorvastatin , with improved compliance since early October. He has started taking aspirin 81 mg daily. He experiences lightheadedness and cramping after playing tennis, attributing it to heat and being out of shape.  Admits he is quite sensitive to heat.  He denies chest pain, shortness of breath, or palpitations during play. He plays tennis regularly but has been less active recently, contributing to weight gain.  His job is sedentary and he admits he needs to be more physically active.  He acknowledges poor dietary habits, including frequent eating out as he is single and prefers not to cook. Family history includes both grandfathers having had heart attacks, with one dying in his early sixties. His parents are in their late seventies with no history of ASCVD. History of social smoking, particularly when drinking, but does not smoke any longer.  He denies orthopnea, PND, edema, presyncope, syncope.  History of hypertension; BP generally well-controlled.   Family history: His family history includes Coronary artery disease in an other family member; Diabetes in an other  family member; Healthy in his brother; Hyperlipidemia in his father and mother; Hypertension in his father and mother; Obesity in his father; Thyroid  cancer in his father.  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  Tennis   No results found for: LIPOA    ROS: See HPI       Studies Reviewed: SABRA   EKG Interpretation Date/Time:  Thursday April 22 2024 09:32:09 EST Ventricular Rate:  68 PR Interval:  136 QRS Duration:  98 QT Interval:  384 QTC Calculation: 408 R Axis:   25  Text Interpretation: Normal sinus rhythm Normal ECG When compared with ECG of 15-Jul-2022 07:51, No significant change was found Confirmed by Percy Browning [phone]) on 04/22/2024 9:38:38 AM      Risk Assessment/Calculations:  Physical Exam:   VS: BP 120/84 (BP Location: Right Arm, Patient Position: Sitting, Cuff Size: Normal)   Pulse 68   Ht 6' 0.5 (1.842 m)   Wt 252 lb 6.4 oz (114.5 kg)   SpO2 98%   BMI 33.76 kg/m   Wt Readings from Last 3 Encounters:  04/22/24 252 lb 6.4 oz (114.5 kg)  02/24/24 257 lb 12.8 oz (116.9 kg)  08/22/23 256 lb (116.1 kg)     GEN: Well nourished, well developed in no acute distress NECK: No JVD; No carotid bruits CARDIAC: RRR, no murmurs, rubs, gallops RESPIRATORY:  Clear to auscultation without rales, wheezing or rhonchi  ABDOMEN: Soft, non-tender,  non-distended EXTREMITIES:  No edema; No deformity     ASSESSMENT AND PLAN: .    Assessment & Plan Coronary artery calcification  Cardiac risk Coronary artery calcification of LAD with a calcium  score of 133 (90th percentile). History of hyperlipidemia and hypertension. Family history of CAD in both grandfathers. He is on aspirin for prevention and is now compliant with statin with prior non-compliance reported.  EKG today reveals NSR, no ST/T abnormality.  Physical activity has primarily been tennis which he reports has been inconsistent recently.  He had some episodes of lightheadedness after playing, primarily in the summer.  He wonders if this is sensitivity to heat.  He had quite a number of questions about statistical risk based on current calcium  score.  Emphasized importance of goal LDL 70 or lower.  Due to bulk of calcification in LAD and elevated percentile, along with additional risk factors of hypertension and hyperlipidemia, we will pursue further ischemia evaluation.  - Coronary CTA for ischemia evaluation and mapping of coronary anatomy - Lopressor 50 mg 2 hours prior to CT - Continue aspirin, amlodipine , olmesartan , atorvastatin  -Lifestyle modifications to aim for increased physical activity, at least 150 minutes of moderate intensity exercise each week - Heart healthy diet avoiding processed food, saturated fat, sugar, and other simple carbohydrates  Hyperlipidemia LDL goal < 70 Lipid panel completed 02/27/2024 with total cholesterol 179, triglycerides 92, HDL 52, and LDL 108.  Admits he would often forget to take atorvastatin  20 mg daily but has been more consistent recently.  He is due for recheck of cholesterol with PCP in a few weeks.  Emphasize importance of LDL goal 70 or lower.  Advised that lipoprotein A levels will guide further management.  - Continue atorvastatin  therapy with consistent daily intake - Recheck cholesterol levels in December for surveillance - Recommend  checking lipoprotein A test is ordered to evaluate additional cardiovascular risk - Dietary guidance provided focuses on a Mediterranean diet and reducing processed foods -  Regular physical activity is encouraged, aiming for 150 minutes of moderate exercise per week  Hypertension BP is well controlled.  Renal function stable on labs completed 02/27/2024.  No change in antihypertensive therapy today - Continue amlodipine , olmesartan   Obesity He reports motivation to lose weight. Weight management is crucial for reducing cardiovascular risk. Regular physical activity, including walking and intentional exercise, is encouraged. Dietary modifications should focus on whole foods and reducing processed foods. Discussed the benefits of weightlifting for muscle strength and metabolism. -  Encouraged setting realistic goals for weight loss and lifestyle changes      Dispo: TBD  Signed, Rosaline Bane, NP-C

## 2024-04-22 NOTE — Patient Instructions (Signed)
 Medication Instructions:   Your physician recommends that you continue on your current medications as directed. Please refer to the Current Medication list given to you today.   *If you need a refill on your cardiac medications before your next appointment, please call your pharmacy*  Lab Work:  Please get LPa, fasting lab with your PCP in December.   If you have labs (blood work) drawn today and your tests are completely normal, you will receive your results only by: MyChart Message (if you have MyChart) OR A paper copy in the mail If you have any lab test that is abnormal or we need to change your treatment, we will call you to review the results.  Testing/Procedures:    Your cardiac CT will be scheduled at one of the below locations:   [facility] [address] [place], [place] [address] [phone] (Severe contrast allergies only)  [facility] [address]  [place], [place] [address]  If scheduled at the [facility] at [address], please enter the parking lot using the [address] entrance and use the FREE valet service at the patient drop-off area. Enter the building and check-in with registration on the main floor.   Please follow these instructions carefully (unless otherwise directed):  An IV will be required for this test and Nitroglycerin  will be given.  Hold all erectile dysfunction medications at least 3 days (72 hrs) prior to test. (Ie viagra, cialis , sildenafil, tadalafil , etc)   On the Night Before the Test: Be sure to Drink plenty of water. Do not consume any caffeinated/decaffeinated beverages or chocolate 12 hours prior to your test. Do not take any antihistamines 12 hours prior to your test.   On the Day of the Test: Drink plenty of water until 1 hour prior to the test. Do not eat any food 1 hour prior to test. You may take your regular medications prior to the test.  Take  metoprolol (Lopressor) one (1) tablet by mouth ( 50 mg) two hours prior to test.      After the Test: Drink plenty of water. After receiving IV contrast, you may experience a mild flushed feeling. This is normal. On occasion, you may experience a mild rash up to 24 hours after the test. This is not dangerous. If this occurs, you can take Benadryl 25 mg, Zyrtec, Claritin, or Allegra and increase your fluid intake. (Patients taking Tikosyn should avoid Benadryl, and may take Zyrtec, Claritin, or Allegra) If you experience trouble breathing, this can be serious. If it is severe call 911 IMMEDIATELY. If it is mild, please call our office.  We will call to schedule your test 2-4 weeks out understanding that some insurance companies will need an authorization prior to the service being performed.   For more information and frequently asked questions, please visit our website : [email or web address]  For non-scheduling related questions, please contact the cardiac imaging nurse navigator should you have any questions/concerns: Cardiac Imaging Nurse Navigators Direct Office Dial: [phone]   For scheduling needs, including cancellations and rescheduling, please call Brittany, [phone].   Follow-Up: At [facility], you and your health needs are our priority.  As part of our continuing mission to provide you with exceptional heart care, our providers are all part of one team.  This team includes your primary Cardiologist (physician) and Advanced Practice Providers or APPs (Physician Assistants and Nurse Practitioners) who all work together to provide you with the  care you need, when you need it.  Your next appointment:   To be based upon test results.    Provider:   Rosaline Bane, NP    We recommend signing up for the patient portal called MyChart.  Sign up information is provided on this After Visit Summary.  MyChart is used to connect with patients for Virtual  Visits (Telemedicine).  Patients are able to view lab/test results, encounter notes, upcoming appointments, etc.  Non-urgent messages can be sent to your provider as well.   To learn more about what you can do with MyChart, go to [email or web address].   Other Instructions     Adopting a Healthy Lifestyle.   Weight: Know what a healthy weight is for you (roughly BMI <25) and aim to maintain this. You can calculate your body mass index on your smart phone. Unfortunately, this is not the most accurate measure of healthy weight, but it is the simplest measurement to use. A more accurate measurement involves body scanning which measures lean muscle, fat tissue and bony density. We do not have this equipment at [facility].    Diet: Aim for 7+ servings of fruits and vegetables daily Limit animal fats in diet for cholesterol and heart health - choose grass fed whenever available Avoid highly processed foods (fast food burgers, tacos, fried chicken, pizza, hot dogs, french fries)  Saturated fat comes in the form of butter, lard, coconut oil, margarine, partially hydrogenated oils, dairy products, and fat in meat. These increase your risk of cardiovascular disease.  Use healthy plant oils, such as olive, canola, soy, corn, sunflower and peanut.  Whole foods such as fruits, vegetables and whole grains have fiber  Men need > 38 grams of fiber per day Women need > 25 grams of fiber per day  Load up on vegetables and fruits - one-half of your plate: Aim for color and variety, and remember that potatoes dont count. Go for whole grains - one-quarter of your plate: Whole wheat, barley, wheat berries, quinoa, oats, brown rice, and foods made with them. If you want pasta, go with whole wheat pasta. Protein power - one-quarter of your plate: Fish, chicken, beans, and nuts are all healthy, versatile protein sources. Limit red meat. You need carbohydrates for energy! The type of carbohydrate is more important  than the amount. Choose carbohydrates such as vegetables, fruits, whole grains, beans, and nuts in the place of white rice, white pasta, potatoes (baked or fried), macaroni and cheese, cakes, cookies, and donuts.  If youre thirsty, drink water. Coffee and tea are good in moderation, but skip sugary drinks and limit milk and dairy products to one or two daily servings. Keep sugar intake at 6 teaspoons or 24 grams or LESS       Exercise: Aim for 150 min of moderate intensity exercise weekly for heart health, and weights twice weekly for bone health Stay active - any steps are better than no steps! Aim for 7-9 hours of sleep daily   Sleep: This provides your body with the reset and relaxation that it needs!  Aim to get 7-8 hours of sleep each night. Limit caffeine, screen time, and other distractions prior to bedtime.  Keep your bedroom cool and dark and do not wear heavy clothing to bed or use heavy bed covers - layer if needed.         Mediterranean Diet  Why follow it? Research shows. Those who follow the Mediterranean diet have a reduced risk of heart disease  The diet is associated with a reduced incidence of Parkinson's and Alzheimer's diseases People following the diet may have longer life expectancies and lower rates of chronic diseases  The Dietary Guidelines for Americans recommends the Mediterranean diet as an eating plan to promote health and prevent disease  What Is the Mediterranean Diet?  Healthy eating plan based on typical foods and recipes of Mediterranean-style cooking The diet is primarily a plant based diet; these foods should make up a majority of meals   Starches - Plant based foods should make up a majority of meals - They are an important sources of vitamins, minerals, energy, antioxidants, and fiber - Choose whole grains, foods high in fiber and minimally processed items  - Typical grain sources include wheat, oats, barley, corn, brown rice, bulgar, farro,  millet, polenta, couscous  - Various types of beans include chickpeas, lentils, fava beans, black beans, white beans   Fruits  Veggies - Large quantities of antioxidant rich fruits & veggies; 6 or more servings  - Vegetables can be eaten raw or lightly drizzled with oil and cooked  - Vegetables common to the traditional Mediterranean Diet include: artichokes, arugula, beets, broccoli, brussel sprouts, cabbage, carrots, celery, collard greens, cucumbers, eggplant, kale, leeks, lemons, lettuce, mushrooms, okra, onions, peas, peppers, potatoes, pumpkin, radishes, rutabaga, shallots, spinach, sweet potatoes, turnips, zucchini - Fruits common to the Mediterranean Diet include: apples, apricots, avocados, cherries, clementines, dates, figs, grapefruits, grapes, melons, nectarines, oranges, peaches, pears, pomegranates, strawberries, tangerines  Fats - Replace butter and margarine with healthy oils, such as olive oil, canola oil, and tahini  - Limit nuts to no more than a handful a day  - Nuts include walnuts, almonds, pecans, pistachios, pine nuts  - Limit or avoid candied, honey roasted or heavily salted nuts - Olives are central to the [employer] - can be eaten whole or used in a variety of dishes   Meats Protein - Limiting red meat: no more than a few times a month - When eating red meat: choose lean cuts and keep the portion to the size of deck of cards - Eggs: approx. 0 to 4 times a week  - Fish and lean poultry: at least 2 a week  - Healthy protein sources include, chicken, turkey, lean beef, lamb - Increase intake of seafood such as tuna, salmon, trout, mackerel, shrimp, scallops - Avoid or limit high fat processed meats such as sausage and bacon  Dairy - Include moderate amounts of low fat dairy products  - Focus on healthy dairy such as fat free yogurt, skim milk, low or reduced fat cheese - Limit dairy products higher in fat such as whole or 2% milk, cheese, ice cream  Alcohol -  Moderate amounts of red wine is ok  - No more than 5 oz daily for women (all ages) and men older than age 64  - No more than 10 oz of wine daily for men younger than 51  Other - Limit sweets and other desserts  - Use herbs and spices instead of salt to flavor foods  - Herbs and spices common to the traditional Mediterranean Diet include: basil, bay leaves, chives, cloves, cumin, fennel, garlic, lavender, marjoram, mint, oregano, parsley, pepper, rosemary, sage, savory, sumac, tarragon, thyme   It's not just a diet, it's a lifestyle:  The Mediterranean diet includes lifestyle factors typical of those in the region  Foods, drinks and meals are best eaten with others and savored Daily physical activity is important  for overall good health This could be strenuous exercise like running and aerobics This could also be more leisurely activities such as walking, housework, yard-work, or taking the stairs Moderation is the key; a balanced and healthy diet accommodates most foods and drinks Consider portion sizes and frequency of consumption of certain foods   Meal Ideas & Options:  Breakfast:  Whole wheat toast or whole wheat English muffins with peanut butter & hard boiled egg Steel cut oats topped with apples & cinnamon and skim milk  Fresh fruit: banana, strawberries, melon, berries, peaches  Smoothies: strawberries, bananas, greek yogurt, peanut butter Low fat greek yogurt with blueberries and granola  Egg white omelet with spinach and mushrooms Breakfast couscous: whole wheat couscous, apricots, skim milk, cranberries  Sandwiches:  Hummus and grilled vegetables (peppers, zucchini, squash) on whole wheat bread   Grilled chicken on whole wheat pita with lettuce, tomatoes, cucumbers or tzatziki  Tuna salad on whole wheat bread: tuna salad made with greek yogurt, olives, red peppers, capers, green onions Garlic rosemary lamb pita: lamb sauted with garlic, rosemary, salt & pepper; add lettuce,  cucumber, greek yogurt to pita - flavor with lemon juice and black pepper  Seafood:  Mediterranean grilled salmon, seasoned with garlic, basil, parsley, lemon juice and black pepper Shrimp, lemon, and spinach whole-grain pasta salad made with low fat greek yogurt  Seared scallops with lemon orzo  Seared tuna steaks seasoned salt, pepper, coriander topped with tomato mixture of olives, tomatoes, olive oil, minced garlic, parsley, green onions and cappers  Meats:  Herbed greek chicken salad with kalamata olives, cucumber, feta  Red bell peppers stuffed with spinach, bulgur, lean ground beef (or lentils) & topped with feta   Kebabs: skewers of chicken, tomatoes, onions, zucchini, squash  Turkey burgers: made with red onions, mint, dill, lemon juice, feta cheese topped with roasted red peppers Vegetarian Cucumber salad: cucumbers, artichoke hearts, celery, red onion, feta cheese, tossed in olive oil & lemon juice  Hummus and whole grain pita points with a greek salad (lettuce, tomato, feta, olives, cucumbers, red onion) Lentil soup with celery, carrots made with vegetable broth, garlic, salt and pepper  Tabouli salad: parsley, bulgur, mint, scallions, cucumbers, tomato, radishes, lemon juice, olive oil, salt and pepper.

## 2024-04-24 ENCOUNTER — Encounter (HOSPITAL_BASED_OUTPATIENT_CLINIC_OR_DEPARTMENT_OTHER): Payer: Self-pay | Admitting: Nurse Practitioner

## 2024-04-27 ENCOUNTER — Other Ambulatory Visit: Payer: Self-pay | Admitting: Family Medicine

## 2024-04-28 DIAGNOSIS — F331 Major depressive disorder, recurrent, moderate: Secondary | ICD-10-CM | POA: Diagnosis not present

## 2024-05-03 ENCOUNTER — Encounter (HOSPITAL_COMMUNITY): Payer: Self-pay

## 2024-05-07 ENCOUNTER — Ambulatory Visit (HOSPITAL_COMMUNITY)
Admission: RE | Admit: 2024-05-07 | Discharge: 2024-05-07 | Disposition: A | Discharge status: Home / Self Care | Source: Ambulatory Visit | Attending: Nurse Practitioner | Admitting: Nurse Practitioner

## 2024-05-07 DIAGNOSIS — R931 Abnormal findings on diagnostic imaging of heart and coronary circulation: Secondary | ICD-10-CM | POA: Diagnosis not present

## 2024-05-07 DIAGNOSIS — I2510 Atherosclerotic heart disease of native coronary artery without angina pectoris: Secondary | ICD-10-CM

## 2024-05-07 MED ORDER — IOHEXOL 350 MG/ML SOLN
100.0000 mL | Freq: Once | INTRAVENOUS | Status: AC | PRN
  Administered 2024-05-07: 100 mL via INTRAVENOUS

## 2024-05-10 ENCOUNTER — Ambulatory Visit (HOSPITAL_BASED_OUTPATIENT_CLINIC_OR_DEPARTMENT_OTHER): Payer: Self-pay | Admitting: Nurse Practitioner

## 2024-05-27 ENCOUNTER — Other Ambulatory Visit: Payer: Self-pay | Admitting: Family Medicine

## 2024-05-27 ENCOUNTER — Ambulatory Visit: Admitting: Family Medicine

## 2024-05-27 ENCOUNTER — Encounter: Payer: Self-pay | Admitting: Family Medicine

## 2024-05-27 VITALS — BP 108/78 | HR 79 | Temp 97.8°F | Ht 72.5 in | Wt 256.6 lb

## 2024-05-27 DIAGNOSIS — E669 Obesity, unspecified: Secondary | ICD-10-CM

## 2024-05-27 DIAGNOSIS — I10 Essential (primary) hypertension: Secondary | ICD-10-CM

## 2024-05-27 DIAGNOSIS — E785 Hyperlipidemia, unspecified: Secondary | ICD-10-CM

## 2024-05-27 DIAGNOSIS — Z23 Encounter for immunization: Secondary | ICD-10-CM | POA: Diagnosis not present

## 2024-05-27 DIAGNOSIS — R931 Abnormal findings on diagnostic imaging of heart and coronary circulation: Secondary | ICD-10-CM | POA: Diagnosis not present

## 2024-05-27 MED ORDER — ZEPBOUND 2.5 MG/0.5ML ~~LOC~~ SOAJ: 2.5000 mg | SUBCUTANEOUS | 5 refills | Status: AC

## 2024-05-27 NOTE — Patient Instructions (Addendum)
 Tetanus, Diphtheria, and Pertussis (Tdap) today   Schedule a lab visit at the check out desk within 2 weeks. Return for future fasting labs meaning nothing but water after midnight please. Ok to take your medications with water.   Trial Zepbound  2.5 mg weekly- if weight loss stagnates we can go to 5 mg but would want at least 1-2 full months on the lowest dose  Recommended follow up: Return in about 6 months (around 11/25/2024) for followup or sooner if needed.Schedule b4 you leave.

## 2024-05-27 NOTE — Progress Notes (Signed)
 Phone [phone] In person visit   Subjective:   Henry Houston is a 53 y.o. year old very pleasant male patient who presents for/with See problem oriented charting Chief Complaint  Patient presents with   Medical Management of Chronic Issues    3 month follow up; pt would like to go over calcium  scoring; pt will come back first of next year to update tetanus and shingles;    Past Medical History-  Patient Active Problem List   Diagnosis Date Noted   Agatston CAC score 100-199 03/09/2024    Priority: High   Urethral stricture in male 10/13/2018    Priority: High   Ex-smoker for less than 1 year 03/12/2016    Priority: High   GAD (generalized anxiety disorder) 05/26/2007    Priority: Medium    Hyperlipidemia 04/16/2007    Priority: Medium    Essential hypertension 04/16/2007    Priority: Medium    Obesity (BMI 30.0-34.9) 04/16/2007    Priority: Low   DEVIATED SEPTUM 04/16/2007    Priority: Low   GERD 04/16/2007    Priority: Low   Low back pain 07/26/2011    Priority: 1.    Medications- reviewed and updated Current Outpatient Medications  Medication Sig Dispense Refill   amLODipine  (NORVASC ) 5 MG tablet TAKE 1 TABLET BY MOUTH EVERY DAY 90 tablet 3   aspirin EC 81 MG tablet Take 81 mg by mouth daily. Swallow whole.     atorvastatin  (LIPITOR) 20 MG tablet TAKE 1 TABLET BY MOUTH EVERY DAY 90 tablet 3   buPROPion (WELLBUTRIN XL) 300 MG 24 hr tablet Take 300 mg by mouth daily.     ipratropium (ATROVENT ) 0.03 % nasal spray PLACE 2 SPRAYS INTO BOTH NOSTRILS EVERY 12 (TWELVE) HOURS. 90 mL 3   metoprolol  tartrate (LOPRESSOR ) 50 MG tablet Take 1 tablet (50 mg total) by mouth once for 1 dose. Take 90-120 minutes prior to scan. Hold for SBP less than 110. 1 tablet 0   olmesartan  (BENICAR ) 40 MG tablet TAKE 1 TABLET BY MOUTH EVERY DAY 90 tablet 3   omeprazole (PRILOSEC) 20 MG capsule Take 20 mg by mouth daily.     tirzepatide  (ZEPBOUND ) 2.5 MG/0.5ML Pen Inject 2.5 mg into  the skin once a week. 2 mL 5   venlafaxine XR (EFFEXOR-XR) 150 MG 24 hr capsule Take 150 mg by mouth every morning.     venlafaxine XR (EFFEXOR-XR) 75 MG 24 hr capsule Take 75 mg by mouth daily.   3   zolpidem (AMBIEN CR) 12.5 MG CR tablet Take 12.5 mg by mouth at bedtime.  3   Cholecalciferol (VITAMIN D3 PO) Take 1 tablet by mouth daily. (Patient not taking: Reported on 05/27/2024)     Cyanocobalamin  (B-12 PO) Take 1 tablet by mouth daily. (Patient not taking: Reported on 05/27/2024)     tadalafil  (CIALIS ) 20 MG tablet Take 1 tablet (20 mg total) by mouth every other day as needed for erectile dysfunction. (Patient not taking: Reported on 05/27/2024) 6 tablet 11   No current facility-administered medications for this visit.     Objective:  BP 108/78 (BP Location: Left Arm, Patient Position: Sitting, Cuff Size: Normal)   Pulse 79   Temp 97.8 F (36.6 C) (Temporal)   Ht 6' 0.5 (1.842 m)   Wt 256 lb 9.6 oz (116.4 kg)   SpO2 98%   BMI 34.32 kg/m  Gen: NAD, resting comfortably     Assessment and Plan     #nonobstructive  CAD- CT calcium  scoring 133 in left anterior descending 03/09/2024 at 90th percentile #hyperlipidemia/obesity S: compliant with atorvastatin  20 mg daily for months.   Diet/exercise-got down some closer to 250s before busy work schedule and holiday parties kicked in- still down 1 pounds from last visit. Had succes with calorie counting  Lab Results  Component Value Date   CHOL 179 02/27/2024   HDL 52.50 02/27/2024   LDLCALC 108 (H) 02/27/2024   LDLDIRECT 76.0 02/13/2022   TRIG 92.0 02/27/2024   CHOLHDL 3 02/27/2024   A/P: for coronary artery calcium  -discussed nonobstructive coronary artery disease and goals of prevention-he is agreeable to continue the atorvastatin  and continue aspirin 81 mg.  Update lipid panel-agrees to come back tomorrow For hyperlipidemia-hoping LDL below 70-he agrees to come back-we may need to adjust dose or add Zetia depending on  results For obesity-ongoing battle with weight loss despite calorie counting in the past and regular exercise and tennis-he would like to try Zepbound  after prolonged discussion today and benefits and risks-I sent this in for him  #hypertension S: compliant with amlodipine  5 mg, olmesartan  40 mg A/P: Blood pressure well-controlled-continue current medication--we may need to adjust if Zepbound  gets approved and he has significant weight loss  Recommended follow up: Return in about 6 months (around 11/25/2024) for followup or sooner if needed.Schedule b4 you leave. Future Appointments  Date Time Provider Department Center  11/26/2024  8:00 AM Katrinka Garnette KIDD, MD [facility]  03/09/2025  8:00 AM Katrinka Garnette KIDD, MD [facility]    Lab/Order associations: NOT fasting- prefers fasting   ICD-10-CM   1. Obesity (BMI 30-39.9)  E66.9     2. Hyperlipidemia, unspecified hyperlipidemia type  E78.5 Comprehensive metabolic panel with GFR    Lipid panel    Lipoprotein A (LPA)    tirzepatide  (ZEPBOUND ) 2.5 MG/0.5ML Pen    3. Agatston CAC score 100-199  R93.1 tirzepatide  (ZEPBOUND ) 2.5 MG/0.5ML Pen    4. Immunization due  Z23 Tdap vaccine greater than or equal to 7yo IM    5. Essential hypertension  I10       Meds ordered this encounter  Medications   tirzepatide  (ZEPBOUND ) 2.5 MG/0.5ML Pen    Sig: Inject 2.5 mg into the skin once a week.    Dispense:  2 mL    Refill:  5    CAD and obesity    Return precautions advised.  Garnette Katrinka, MD

## 2024-05-28 ENCOUNTER — Other Ambulatory Visit (INDEPENDENT_AMBULATORY_CARE_PROVIDER_SITE_OTHER)

## 2024-05-28 ENCOUNTER — Ambulatory Visit: Payer: Self-pay | Admitting: Family Medicine

## 2024-05-28 DIAGNOSIS — I10 Essential (primary) hypertension: Secondary | ICD-10-CM

## 2024-05-28 DIAGNOSIS — E785 Hyperlipidemia, unspecified: Secondary | ICD-10-CM

## 2024-05-28 LAB — COMPREHENSIVE METABOLIC PANEL WITH GFR
ALT: 25 U/L (ref 3–53)
AST: 19 U/L (ref 5–37)
Albumin: 4.4 g/dL (ref 3.5–5.2)
Alkaline Phosphatase: 58 U/L (ref 39–117)
BUN: 16 mg/dL (ref 6–23)
CO2: 26 meq/L (ref 19–32)
Calcium: 9.3 mg/dL (ref 8.4–10.5)
Chloride: 101 meq/L (ref 96–112)
Creatinine, Ser: 1.08 mg/dL (ref 0.40–1.50)
GFR: 79.4 mL/min
Glucose, Bld: 111 mg/dL — ABNORMAL HIGH (ref 70–99)
Potassium: 4.4 meq/L (ref 3.5–5.1)
Sodium: 136 meq/L (ref 135–145)
Total Bilirubin: 0.6 mg/dL (ref 0.2–1.2)
Total Protein: 7.3 g/dL (ref 6.0–8.3)

## 2024-05-28 LAB — LIPID PANEL
Cholesterol: 152 mg/dL (ref 28–200)
HDL: 51.2 mg/dL
LDL Cholesterol: 85 mg/dL (ref 10–99)
NonHDL: 101.09
Total CHOL/HDL Ratio: 3
Triglycerides: 82 mg/dL (ref 10.0–149.0)
VLDL: 16.4 mg/dL (ref 0.0–40.0)

## 2024-05-28 LAB — LDL CHOLESTEROL, DIRECT: Direct LDL: 91 mg/dL

## 2024-06-01 ENCOUNTER — Encounter (HOSPITAL_COMMUNITY): Payer: Self-pay | Admitting: Emergency Medicine

## 2024-06-01 ENCOUNTER — Other Ambulatory Visit: Payer: Self-pay

## 2024-06-01 ENCOUNTER — Emergency Department (HOSPITAL_COMMUNITY)
Admission: EM | Admit: 2024-06-01 | Discharge: 2024-06-01 | Disposition: A | Discharge status: Home / Self Care | Attending: Emergency Medicine | Admitting: Emergency Medicine

## 2024-06-01 DIAGNOSIS — Z7982 Long term (current) use of aspirin: Secondary | ICD-10-CM | POA: Diagnosis not present

## 2024-06-01 DIAGNOSIS — R310 Gross hematuria: Secondary | ICD-10-CM | POA: Diagnosis not present

## 2024-06-01 LAB — URINALYSIS, ROUTINE W REFLEX MICROSCOPIC
Bilirubin Urine: NEGATIVE
Glucose, UA: NEGATIVE mg/dL
Ketones, ur: NEGATIVE mg/dL
Nitrite: NEGATIVE
Protein, ur: NEGATIVE mg/dL
Specific Gravity, Urine: 1.004 — ABNORMAL LOW (ref 1.005–1.030)
pH: 6 (ref 5.0–8.0)

## 2024-06-01 NOTE — ED Provider Notes (Signed)
 " [place] EMERGENCY DEPARTMENT AT [facility] Provider Note   CSN: [id number] Arrival date & time: 06/01/24  1718     Patient presents with: Hematuria   Henry Houston is a 53 y.o. male has a history of a urethral stricture and self catheterizes weekly to dilate the stricture.  States that when he was catheterizing today he incidentally thinks he did so to forcefully and had hematuria as a result.  This has been slowing down and almost is resolved upon presentation however he is concerned that after reading on the Internet he may have induced a false passage.  At this time he does not have any pain or hematuria, but does have concerns for urology follow-up.    Hematuria       Prior to Admission medications  Medication Sig Start Date End Date Taking? Authorizing Provider  amLODipine  (NORVASC ) 5 MG tablet TAKE 1 TABLET BY MOUTH EVERY DAY 02/03/23   Katrinka Garnette KIDD, MD  aspirin EC 81 MG tablet Take 81 mg by mouth daily. Swallow whole.    [provider]  atorvastatin  (LIPITOR) 20 MG tablet TAKE 1 TABLET BY MOUTH EVERY DAY 07/31/22   Katrinka Garnette KIDD, MD  buPROPion (WELLBUTRIN XL) 300 MG 24 hr tablet Take 300 mg by mouth daily. 01/13/14   [provider]  Cholecalciferol (VITAMIN D3 PO) Take 1 tablet by mouth daily. Patient not taking: Reported on 05/27/2024    [provider]  Cyanocobalamin  (B-12 PO) Take 1 tablet by mouth daily. Patient not taking: Reported on 05/27/2024    [provider]  ipratropium (ATROVENT ) 0.03 % nasal spray PLACE 2 SPRAYS INTO BOTH NOSTRILS EVERY 12 (TWELVE) HOURS. 01/29/23   Katrinka Garnette KIDD, MD  metoprolol  tartrate (LOPRESSOR ) 50 MG tablet Take 1 tablet (50 mg total) by mouth once for 1 dose. Take 90-120 minutes prior to scan. Hold for SBP less than 110. 04/22/24 05/27/24  Swinyer, Rosaline HERO, NP  olmesartan  (BENICAR ) 40 MG tablet TAKE 1 TABLET BY MOUTH EVERY DAY 04/27/24   Katrinka Garnette KIDD, MD   omeprazole (PRILOSEC) 20 MG capsule Take 20 mg by mouth daily.    [provider]  tadalafil  (CIALIS ) 20 MG tablet Take 1 tablet (20 mg total) by mouth every other day as needed for erectile dysfunction. Patient not taking: Reported on 05/27/2024 02/18/22   Katrinka Garnette KIDD, MD  tirzepatide  (ZEPBOUND ) 2.5 MG/0.5ML Pen Inject 2.5 mg into the skin once a week. 05/27/24   Katrinka Garnette KIDD, MD  venlafaxine XR (EFFEXOR-XR) 150 MG 24 hr capsule Take 150 mg by mouth every morning. 04/28/24   [provider]  venlafaxine XR (EFFEXOR-XR) 75 MG 24 hr capsule Take 75 mg by mouth daily.  02/12/18   [provider]  zolpidem (AMBIEN CR) 12.5 MG CR tablet Take 12.5 mg by mouth at bedtime. 02/12/18   [provider]    Allergies: Levaquin  [levofloxacin in d5w]    Review of Systems  Genitourinary:  Positive for hematuria.  All other systems reviewed and are negative.   Updated Vital Signs BP (!) 163/96 (BP Location: Right Arm)   Pulse 71   Temp 97.7 F (36.5 C) (Oral)   Resp 17   SpO2 100%   Physical Exam Vitals and nursing note reviewed.  Constitutional:      General: He is not in acute distress.    Appearance: Normal appearance.  HENT:     Head: Normocephalic and atraumatic.  Mouth/Throat:     Mouth: Mucous membranes are moist.     Pharynx: Oropharynx is clear.  Eyes:     Extraocular Movements: Extraocular movements intact.     Conjunctiva/sclera: Conjunctivae normal.     Pupils: Pupils are equal, round, and reactive to light.  Cardiovascular:     Rate and Rhythm: Normal rate and regular rhythm.     Pulses: Normal pulses.     Heart sounds: Normal heart sounds. No murmur heard.    No friction rub. No gallop.  Pulmonary:     Effort: Pulmonary effort is normal.     Breath sounds: Normal breath sounds.  Abdominal:     General: Abdomen is flat. Bowel sounds are normal.     Palpations: Abdomen is soft.     Tenderness: There is no abdominal  tenderness.  Musculoskeletal:        General: Normal range of motion.     Cervical back: Normal range of motion and neck supple.     Right lower leg: No edema.     Left lower leg: No edema.  Skin:    General: Skin is warm and dry.     Capillary Refill: Capillary refill takes less than 2 seconds.  Neurological:     General: No focal deficit present.     Mental Status: He is alert. Mental status is at baseline.  Psychiatric:        Mood and Affect: Mood normal.     (all labs ordered are listed, but only abnormal results are displayed) Labs Reviewed  URINALYSIS, ROUTINE W REFLEX MICROSCOPIC - Abnormal; Notable for the following components:      Result Value   Color, Urine STRAW (*)    Specific Gravity, Urine 1.004 (*)    Hgb urine dipstick LARGE (*)    Leukocytes,Ua TRACE (*)    Bacteria, UA RARE (*)    All other components within normal limits    EKG: None  Radiology: No results found.   Procedures   Medications Ordered in the ED - No data to display                                  Medical Decision Making Amount and/or Complexity of Data Reviewed Labs: ordered.   Patient does not present with any gross hematuria however on the urine sample does have some microscopic hematuria, and in line with his history of present illness, believe that this is likely secondary to minor trauma due to the self-catheterization that is resolving.  He does not have any signs of bladder distention and is able to pass urine without difficulty.  Given this reassuring finding believe that at this time he be amenable to outpatient follow-up with urology.  Discussed this thoroughly with the patient in which he verbalizes understanding and agreement and will schedule follow-up with his urologist within the next coming week.  Red flag symptoms and return precautions discussed with the patient which he verbalizes understanding and agreement.  He does request antibiotics however discussion was had  that since he does not have any signs of UTI at this time this will be deferred however again discussed return precautions should he have any dysuria or increased frequency in the coming days.  Again verbalizes understanding and agreement, no further concerns at this time.  Will discharge with outpatient follow-up as discussed.     Final diagnoses:  Gross hematuria  ED Discharge Orders     None          Myriam Dorn BROCKS, [place] 06/01/24 2130    Lenor Hollering, MD 06/01/24 2313  "

## 2024-06-01 NOTE — ED Triage Notes (Signed)
 Pt reports extensive urology hx. Reports he uses a cath once/week. Reports when he cathed himself this week he thinks he hit his prostate & had bleeding after cathing himself x 1 hr ago. Denies pain. No issues urinating.

## 2024-06-01 NOTE — Discharge Instructions (Signed)
 Urged to follow-up with [facility] for follow-up of this episode of hematuria.  If you have worsening pain or discomfort with urination or as we discussed any change in the odor or appearance of the urine please present for reevaluation otherwise follow-up with your urology as discussed.

## 2024-06-02 ENCOUNTER — Telehealth: Payer: Self-pay

## 2024-06-02 LAB — LIPOPROTEIN A (LPA): Lipoprotein (a): 28 nmol/L

## 2024-06-02 NOTE — Telephone Encounter (Signed)
 Transition Care Management Follow-up Telephone Call Date of discharge and from where: 06/01/24 [facility] How have you been since you were released from the hospital? Better  Any questions or concerns? No  Items Reviewed: Did the pt receive and understand the discharge instructions provided? Yes  Medications obtained and verified? Yes  Other? No  Any new allergies since your discharge? No  Dietary orders reviewed? No Do you have support at home? Yes   Home Care and Equipment/Supplies: Were home health services ordered? not applicable If so, what is the name of the agency?   Has the agency set up a time to come to the patient's home? not applicable Were any new equipment or medical supplies ordered?  No What is the name of the medical supply agency?  Were you able to get the supplies/equipment? not applicable Do you have any questions related to the use of the equipment or supplies? No  Functional Questionnaire: (I = Independent and D = Dependent) ADLs: I  Bathing/Dressing- I  Meal Prep- I  Eating- I  Maintaining continence- I  Transferring/Ambulation- I  Managing Meds- I  Follow up appointments reviewed:  PCP Hospital f/u appt confirmed? No  Appt with PCP not needed Specialist Hospital f/u appt confirmed? No  Patient scheduling with Urologist after Christmas Are transportation arrangements needed? No  If their condition worsens, is the pt aware to call PCP or go to the Emergency Dept.? Yes Was the patient provided with contact information for the PCP's office or ED? Yes Was to pt encouraged to call back with questions or concerns? Yes

## 2024-06-09 ENCOUNTER — Other Ambulatory Visit (HOSPITAL_COMMUNITY): Payer: Self-pay

## 2024-06-09 ENCOUNTER — Telehealth: Payer: Self-pay

## 2024-06-09 NOTE — Telephone Encounter (Signed)
 Pharmacy Patient Advocate Encounter   Received notification from Physician's Office that prior authorization for Zepbound  2.5MG /0.5ML pen-injectors is required/requested.   Insurance verification completed.   The patient is insured through [facility].   Per test claim: PA required; PA submitted to above mentioned insurance via Latent Key/confirmation #/EOC [facility] Status is pending

## 2024-06-09 NOTE — Telephone Encounter (Signed)
 Copied from CRM #[id number]. Topic: Clinical - Medication Prior Auth >> Jun 08, 2024  4:45 PM Ashley R wrote: Reason for CRM: Requesting status of zepbound  prior auth from order 12/18  Can we run a PA on this rx  Thank you,  Avelina Situ

## 2024-06-14 ENCOUNTER — Other Ambulatory Visit (HOSPITAL_COMMUNITY): Payer: Self-pay

## 2024-06-25 ENCOUNTER — Encounter (HOSPITAL_COMMUNITY): Payer: Self-pay

## 2024-06-25 ENCOUNTER — Other Ambulatory Visit: Payer: Self-pay

## 2024-06-25 ENCOUNTER — Emergency Department (HOSPITAL_COMMUNITY)
Admission: EM | Admit: 2024-06-25 | Discharge: 2024-06-26 | Disposition: A | Discharge status: Home / Self Care | Attending: Emergency Medicine | Admitting: Emergency Medicine

## 2024-06-25 DIAGNOSIS — Z7982 Long term (current) use of aspirin: Secondary | ICD-10-CM | POA: Insufficient documentation

## 2024-06-25 DIAGNOSIS — R339 Retention of urine, unspecified: Secondary | ICD-10-CM | POA: Diagnosis present

## 2024-06-25 DIAGNOSIS — Z79899 Other long term (current) drug therapy: Secondary | ICD-10-CM | POA: Diagnosis not present

## 2024-06-25 LAB — CBC WITH DIFFERENTIAL/PLATELET
Abs Immature Granulocytes: 0.02 K/uL (ref 0.00–0.07)
Basophils Absolute: 0 K/uL (ref 0.0–0.1)
Basophils Relative: 1 %
Eosinophils Absolute: 0 K/uL (ref 0.0–0.5)
Eosinophils Relative: 0 %
HCT: 38 % — ABNORMAL LOW (ref 39.0–52.0)
Hemoglobin: 13.9 g/dL (ref 13.0–17.0)
Immature Granulocytes: 0 %
Lymphocytes Relative: 21 %
Lymphs Abs: 1.7 K/uL (ref 0.7–4.0)
MCH: 31.7 pg (ref 26.0–34.0)
MCHC: 36.6 g/dL — ABNORMAL HIGH (ref 30.0–36.0)
MCV: 86.6 fL (ref 80.0–100.0)
Monocytes Absolute: 1 K/uL (ref 0.1–1.0)
Monocytes Relative: 13 %
Neutro Abs: 5.3 K/uL (ref 1.7–7.7)
Neutrophils Relative %: 65 %
Platelets: 244 K/uL (ref 150–400)
RBC: 4.39 MIL/uL (ref 4.22–5.81)
RDW: 12.2 % (ref 11.5–15.5)
WBC: 8.1 K/uL (ref 4.0–10.5)
nRBC: 0 % (ref 0.0–0.2)

## 2024-06-25 LAB — URINALYSIS, ROUTINE W REFLEX MICROSCOPIC
Bacteria, UA: NONE SEEN
Bilirubin Urine: NEGATIVE
Glucose, UA: NEGATIVE mg/dL
Hgb urine dipstick: NEGATIVE
Ketones, ur: 5 mg/dL — AB
Nitrite: NEGATIVE
Protein, ur: NEGATIVE mg/dL
Specific Gravity, Urine: 1.018 (ref 1.005–1.030)
pH: 5 (ref 5.0–8.0)

## 2024-06-25 MED ORDER — SODIUM CHLORIDE 0.9 % IV BOLUS
1000.0000 mL | Freq: Once | INTRAVENOUS | Status: AC
  Administered 2024-06-25: 1000 mL via INTRAVENOUS

## 2024-06-25 NOTE — ED Triage Notes (Addendum)
 Pt came in for urinary retention for 7 hours. Pt stated he was here in December for retention as well but there was a heavy flow of blood streaming. Pt self catherizes but has not been able to produce any output today. However, there was blood on his catheter but still no output.

## 2024-06-25 NOTE — Discharge Instructions (Addendum)
 You were evaluated in the Emergency Department and after careful evaluation, we did not find any emergent condition requiring admission or further testing in the hospital.  Your exam/testing today is overall reassuring.  Take the Keflex  antibiotic as directed, take the Flomax  daily, follow-up with your urologist.  Please return to the Emergency Department if you experience any worsening of your condition.   Thank you for allowing us  to be a part of your care.

## 2024-06-25 NOTE — ED Provider Notes (Signed)
 "  EMERGENCY DEPARTMENT AT [facility] Provider Note   CSN: [id number] Arrival date & time: 06/25/24  1727     Patient presents with: Urinary Retention   Henry Houston is a 54 y.o. male.  He has a history of urethral stricture and follows with Dr. Cam urology.  He said they have done balloon dilation in the past and he is supposed to pass the catheter every few days to keep the passage open.  He said he does not do it as routinely as he should but that he did it a few weeks ago and caused some significant bleeding.  That is resolved and he attempted to pass a catheter today and that caused discomfort.  Since then he has had difficulty passing urine.  Initially could not pass any urine but now that he has been waiting here a few hours he is able to dribble a little bit.  He is complaining of a little bit of lower abdominal discomfort.  No fevers chills nausea vomiting.  He has an appointment with urology in a few weeks.   The history is provided by the patient.       Prior to Admission medications  Medication Sig Start Date End Date Taking? Authorizing Provider  amLODipine  (NORVASC ) 5 MG tablet TAKE 1 TABLET BY MOUTH EVERY DAY 02/03/23   Katrinka Garnette KIDD, MD  aspirin EC 81 MG tablet Take 81 mg by mouth daily. Swallow whole.    [provider]  atorvastatin  (LIPITOR) 20 MG tablet TAKE 1 TABLET BY MOUTH EVERY DAY 07/31/22   Katrinka Garnette KIDD, MD  buPROPion (WELLBUTRIN XL) 300 MG 24 hr tablet Take 300 mg by mouth daily. 01/13/14   [provider]  Cholecalciferol (VITAMIN D3 PO) Take 1 tablet by mouth daily. Patient not taking: Reported on 05/27/2024    [provider]  Cyanocobalamin  (B-12 PO) Take 1 tablet by mouth daily. Patient not taking: Reported on 05/27/2024    [provider]  ipratropium (ATROVENT ) 0.03 % nasal spray PLACE 2 SPRAYS INTO BOTH NOSTRILS EVERY 12 (TWELVE) HOURS. 01/29/23   Katrinka Garnette KIDD, MD   metoprolol  tartrate (LOPRESSOR ) 50 MG tablet Take 1 tablet (50 mg total) by mouth once for 1 dose. Take 90-120 minutes prior to scan. Hold for SBP less than 110. 04/22/24 05/27/24  Swinyer, Rosaline HERO, NP  olmesartan  (BENICAR ) 40 MG tablet TAKE 1 TABLET BY MOUTH EVERY DAY 04/27/24   Katrinka Garnette KIDD, MD  omeprazole (PRILOSEC) 20 MG capsule Take 20 mg by mouth daily.    [provider]  tadalafil  (CIALIS ) 20 MG tablet Take 1 tablet (20 mg total) by mouth every other day as needed for erectile dysfunction. Patient not taking: Reported on 05/27/2024 02/18/22   Katrinka Garnette KIDD, MD  tirzepatide  (ZEPBOUND ) 2.5 MG/0.5ML Pen Inject 2.5 mg into the skin once a week. 05/27/24   Katrinka Garnette KIDD, MD  venlafaxine XR (EFFEXOR-XR) 150 MG 24 hr capsule Take 150 mg by mouth every morning. 04/28/24   [provider]  venlafaxine XR (EFFEXOR-XR) 75 MG 24 hr capsule Take 75 mg by mouth daily.  02/12/18   [provider]  zolpidem (AMBIEN CR) 12.5 MG CR tablet Take 12.5 mg by mouth at bedtime. 02/12/18   [provider]    Allergies: Levaquin  [levofloxacin in d5w]    Review of Systems  Gastrointestinal:  Positive for abdominal pain. Negative for nausea and vomiting.  Genitourinary:  Positive for difficulty  urinating. Negative for testicular pain.    Updated Vital Signs BP (!) 159/95   Pulse 94   Temp 97.8 F (36.6 C) (Oral)   Resp 18   SpO2 95%   Physical Exam Vitals and nursing note reviewed.  Constitutional:      Appearance: Normal appearance. He is well-developed.  HENT:     Head: Normocephalic and atraumatic.  Eyes:     Conjunctiva/sclera: Conjunctivae normal.  Pulmonary:     Effort: Pulmonary effort is normal.  Abdominal:     Tenderness: There is no abdominal tenderness. There is no guarding or rebound.  Musculoskeletal:     Cervical back: Neck supple.  Skin:    General: Skin is warm and dry.  Neurological:     General: No focal deficit present.      Mental Status: He is alert.     GCS: GCS eye subscore is 4. GCS verbal subscore is 5. GCS motor subscore is 6.     (all labs ordered are listed, but only abnormal results are displayed) Labs Reviewed  URINALYSIS, ROUTINE W REFLEX MICROSCOPIC - Abnormal; Notable for the following components:      Result Value   Ketones, ur 5 (*)    Leukocytes,Ua SMALL (*)    All other components within normal limits  BASIC METABOLIC PANEL WITH GFR - Abnormal; Notable for the following components:   Sodium 130 (*)    Chloride 95 (*)    CO2 21 (*)    Glucose, Bld 109 (*)    All other components within normal limits  CBC WITH DIFFERENTIAL/PLATELET - Abnormal; Notable for the following components:   HCT 38.0 (*)    MCHC 36.6 (*)    All other components within normal limits    EKG: None  Radiology: No results found.   Procedures   Medications Ordered in the ED  sodium chloride  0.9 % bolus 1,000 mL (0 mLs Intravenous Stopped 06/26/24 0151)  cephALEXin  (KEFLEX ) capsule 500 mg (500 mg Oral Given 06/26/24 0035)  cefTRIAXone  (ROCEPHIN ) 2 g in sodium chloride  0.9 % 100 mL IVPB (0 g Intravenous Stopped 06/26/24 0221)  ondansetron  (ZOFRAN ) injection 4 mg (4 mg Intravenous Given 06/26/24 0150)    Clinical Course as of 06/26/24 0933  Fri Jun 25, 2024  2336 Bladder scan was not giving consistent results.  Patient agreeable to placement of catheter.  Coud was used.  Minimal amount of clear urine out.  No discomfort with inflation of balloon.  Will give some IV fluids and monitor her urine output. [MB]    Clinical Course User Index [MB] Towana Ozell BROCKS, MD                                 Medical Decision Making Amount and/or Complexity of Data Reviewed Labs: ordered.  Risk Prescription drug management.   This patient complains of difficulty urinating; this involves an extensive number of treatment Options and is a complaint that carries with it a high risk of complications and morbidity. The  differential includes urinary retention, infection, trauma, bladder spasm  I ordered, reviewed and interpreted labs, which included CBC normal, chemistries with low sodium, urinalysis without clear signs of infection I ordered medication oral antibiotics and reviewed PMP when indicated. I ordered imaging studies which included bladder scan and I independently    visualized and interpreted imaging which showed no significant retention Previous records obtained and reviewed in epic  including prior urology notes  Cardiac monitoring reviewed, sinus rhythm Social determinants considered, no significant barriers Critical Interventions: None  After the interventions stated above, I reevaluated the patient and found patient to be urinating well after placement of Foley catheter.  No signs of any gross hematuria. Admission and further testing considered, will cover with antibiotics due to placement of catheter.  Will give IV fluids to make sure catheter is adequately flowing.  Care signed out to Dr. Theadore with expectation that he can be discharged after finishes up IV fluids.  I also secure message to urology to let them know he would need an urgent appointment this week.      Final diagnoses:  Urinary retention    ED Discharge Orders          Ordered    cephALEXin  (KEFLEX ) 500 MG capsule  2 times daily        06/26/24 0006    tamsulosin  (FLOMAX ) 0.4 MG CAPS capsule  Daily        06/26/24 0006               Towana Ozell BROCKS, MD 06/26/24 [phone]  "

## 2024-06-25 NOTE — ED Provider Triage Note (Signed)
 Emergency Medicine Provider Triage Evaluation Note  Henry Houston , a 54 y.o. male  was evaluated in triage.  Pt complains of urinary retention.  History of urethral stricture, patient has to self catheterize once a month or so, he felt that he was having more difficulty passing urine earlier today so when he went to self catheterize he noted some blood on the tip of the catheter, he was unable to pass the catheter fully due to meeting resistance, since 11 AM he has not been able to pass any urine despite the fact that he has been drinking plenty of water today. Patient of [facility]  Review of Systems  Positive: As above Negative: As above  Physical Exam  BP (!) 159/95   Pulse 94   Temp 97.8 F (36.6 C) (Oral)   Resp 18   SpO2 95%  Gen:   Awake, no distress   Resp:  Normal effort  MSK:   Moves extremities without difficulty  Other:    Medical Decision Making  Medically screening exam initiated at 6:11 PM.  Appropriate orders placed.  Henry Houston was informed that the remainder of the evaluation will be completed by another provider, this initial triage assessment does not replace that evaluation, and the importance of remaining in the ED until their evaluation is complete.     Henry Houston, [place] 06/25/24 1812

## 2024-06-26 LAB — BASIC METABOLIC PANEL WITH GFR
Anion gap: 14 (ref 5–15)
BUN: 10 mg/dL (ref 6–20)
CO2: 21 mmol/L — ABNORMAL LOW (ref 22–32)
Calcium: 9.2 mg/dL (ref 8.9–10.3)
Chloride: 95 mmol/L — ABNORMAL LOW (ref 98–111)
Creatinine, Ser: 0.89 mg/dL (ref 0.61–1.24)
GFR, Estimated: >60 mL/min
Glucose, Bld: 109 mg/dL — ABNORMAL HIGH (ref 70–99)
Potassium: 3.5 mmol/L (ref 3.5–5.1)
Sodium: 130 mmol/L — ABNORMAL LOW (ref 135–145)

## 2024-06-26 MED ORDER — CEPHALEXIN 500 MG PO CAPS: 500.0000 mg | ORAL_CAPSULE | Freq: Two times a day (BID) | ORAL | 0 refills | Status: AC

## 2024-06-26 MED ORDER — CEPHALEXIN 500 MG PO CAPS
500.0000 mg | ORAL_CAPSULE | Freq: Once | ORAL | Status: AC
  Administered 2024-06-26: 500 mg via ORAL
  Filled 2024-06-26: qty 1

## 2024-06-26 MED ORDER — TAMSULOSIN HCL 0.4 MG PO CAPS: 0.4000 mg | ORAL_CAPSULE | Freq: Every day | ORAL | 0 refills | Status: AC

## 2024-06-26 MED ORDER — SODIUM CHLORIDE 0.9 % IV SOLN
2.0000 g | Freq: Once | INTRAVENOUS | Status: AC
  Administered 2024-06-26: 2 g via INTRAVENOUS
  Filled 2024-06-26: qty 20

## 2024-06-26 MED ORDER — ONDANSETRON HCL 4 MG/2ML IJ SOLN
4.0000 mg | Freq: Once | INTRAMUSCULAR | Status: AC
  Administered 2024-06-26: 4 mg via INTRAVENOUS
  Filled 2024-06-26: qty 2

## 2024-06-26 NOTE — ED Provider Notes (Signed)
" °  Provider Note MRN:  [id number]  Arrival date & time: 06/26/24    ED Course and Medical Decision Making  Assumed care of patient at sign-out or upon transfer.  Urinary retention and also concern for UTI, Foley placed.  Patient with some vomiting right after the Keflex .  On my exam he is well-appearing, plan is to provide IV ceftriaxone , nausea medicine, reassess.  Looks like he should still be appropriate for discharge as long as the vomiting subsides.  He thinks he vomited because he took the antibiotics on empty stomach.  No signs of significant allergic reaction at this time.  Patient doing better, would like to go home, plan is for discharge with return precautions.  Procedures  Final Clinical Impressions(s) / ED Diagnoses     ICD-10-CM   1. Urinary retention  R33.9       ED Discharge Orders          Ordered    cephALEXin  (KEFLEX ) 500 MG capsule  2 times daily        06/26/24 0006    tamsulosin  (FLOMAX ) 0.4 MG CAPS capsule  Daily        06/26/24 0006              Discharge Instructions      You were evaluated in the Emergency Department and after careful evaluation, we did not find any emergent condition requiring admission or further testing in the hospital.  Your exam/testing today is overall reassuring.  Take the Keflex  antibiotic as directed, take the Flomax  daily, follow-up with your urologist.  Please return to the Emergency Department if you experience any worsening of your condition.   Thank you for allowing us  to be a part of your care.      Ozell HERO. Theadore, MD [facility] Health Emergency Medicine [facility] [email or web address]    Theadore Ozell HERO, MD 06/26/24 [phone]  "

## 2024-06-26 NOTE — ED Notes (Signed)
Pt vomited all over floor.

## 2024-06-29 ENCOUNTER — Other Ambulatory Visit (HOSPITAL_COMMUNITY): Payer: Self-pay

## 2024-06-29 NOTE — Telephone Encounter (Signed)
 Insurance faxed form, never received it. I called and completed prior auth over the phone. Faxed chart notes over to plan. @ [phone] case number [id number]

## 2024-06-29 NOTE — Telephone Encounter (Signed)
 Pharmacy Patient Advocate Encounter  Received notification from [employer] that Prior Authorization for Zepbound  2.5MG /0.5ML pen-injectors  has been APPROVED from 06/29/2024 to 06/29/2025. Ran test claim, Copay is $24.99. This test claim was processed through [facility]- copay amounts may vary at other pharmacies due to pharmacy/plan contracts, or as the patient moves through the different stages of their insurance plan.   PA #/Case ID/Reference #: [id number]

## 2024-06-30 ENCOUNTER — Other Ambulatory Visit (HOSPITAL_COMMUNITY): Payer: Self-pay

## 2024-07-01 ENCOUNTER — Telehealth: Payer: Self-pay

## 2024-07-01 NOTE — Telephone Encounter (Signed)
 Transition Care Management Follow-up Telephone Call Date of discharge and from where: 06/25/2024 How have you been since you were released from the hospital? [facility] Any questions or concerns? No  Items Reviewed: Did the pt receive and understand the discharge instructions provided? Yes  Medications obtained and verified? No  Other? No  Any new allergies since your discharge? No  Dietary orders reviewed? No Do you have support at home? No  Lives alone reports divorced  Home Care and Equipment/Supplies: Were home health services ordered? not applicable If so, what is the name of the agency?   Has the agency set up a time to come to the patient's home? not applicable Were any new equipment or medical supplies ordered?  No What is the name of the medical supply agency?  Were you able to get the supplies/equipment? not applicable Do you have any questions related to the use of the equipment or supplies? No  Functional Questionnaire: (I = Independent and D = Dependent) ADLs: I  Bathing/Dressing- I  Eating- I  Maintaining continence- I  Transferring/Ambulation- I  Managing Meds- I  Follow up appointments reviewed:  PCP Hospital f/u appt confirmed? No   Specialist Hospital f/u appt confirmed? Yes  Scheduled to see Dr Cam  on 07/05/24. Are transportation arrangements needed? No  If their condition worsens, is the pt aware to call PCP or go to the Emergency Dept.? Yes Was the patient provided with contact information for the PCP's office or ED? Yes Was to pt encouraged to call back with questions or concerns? Yes

## 2024-11-26 ENCOUNTER — Ambulatory Visit: Admitting: Family Medicine

## 2025-03-09 ENCOUNTER — Encounter: Admitting: Family Medicine
